# Patient Record
Sex: Female | Born: 1955 | Race: Black or African American | Hispanic: No | Smoking: Never smoker
Health system: Southern US, Community
[De-identification: ages and names within clinical notes are randomized; demographics above are authoritative.]

## PROBLEM LIST (undated history)

## (undated) DIAGNOSIS — E559 Vitamin D deficiency, unspecified: Secondary | ICD-10-CM

## (undated) DIAGNOSIS — E785 Hyperlipidemia, unspecified: Secondary | ICD-10-CM

## (undated) DIAGNOSIS — M79643 Pain in unspecified hand: Secondary | ICD-10-CM

## (undated) DIAGNOSIS — M1990 Unspecified osteoarthritis, unspecified site: Secondary | ICD-10-CM

## (undated) DIAGNOSIS — I10 Essential (primary) hypertension: Secondary | ICD-10-CM

## (undated) DIAGNOSIS — M549 Dorsalgia, unspecified: Secondary | ICD-10-CM

## (undated) DIAGNOSIS — M79606 Pain in leg, unspecified: Secondary | ICD-10-CM

## (undated) HISTORY — PX: CARPAL TUNNEL RELEASE: SHX101

## (undated) HISTORY — DX: Dorsalgia, unspecified: M54.9

## (undated) HISTORY — DX: Vitamin D deficiency, unspecified: E55.9

## (undated) HISTORY — DX: Hyperlipidemia, unspecified: E78.5

## (undated) HISTORY — PX: ABDOMINAL HYSTERECTOMY: SHX81

## (undated) HISTORY — DX: Pain in leg, unspecified: M79.606

## (undated) HISTORY — DX: Unspecified osteoarthritis, unspecified site: M19.90

## (undated) HISTORY — DX: Pain in unspecified hand: M79.643

---

## 1976-04-20 HISTORY — PX: TUBAL LIGATION: SHX77

## 2000-01-21 ENCOUNTER — Emergency Department (HOSPITAL_COMMUNITY): Admission: EM | Admit: 2000-01-21 | Discharge: 2000-01-22 | Payer: Self-pay

## 2000-08-24 ENCOUNTER — Emergency Department (HOSPITAL_COMMUNITY): Admission: EM | Admit: 2000-08-24 | Discharge: 2000-08-24 | Payer: Self-pay | Admitting: Emergency Medicine

## 2001-01-21 ENCOUNTER — Ambulatory Visit (HOSPITAL_COMMUNITY): Admission: RE | Admit: 2001-01-21 | Discharge: 2001-01-21 | Payer: Self-pay | Admitting: Family Medicine

## 2001-01-21 ENCOUNTER — Encounter: Payer: Self-pay | Admitting: Family Medicine

## 2001-01-24 ENCOUNTER — Encounter: Payer: Self-pay | Admitting: Family Medicine

## 2001-01-24 ENCOUNTER — Ambulatory Visit (HOSPITAL_COMMUNITY): Admission: RE | Admit: 2001-01-24 | Discharge: 2001-01-24 | Payer: Self-pay | Admitting: Family Medicine

## 2001-04-10 ENCOUNTER — Emergency Department (HOSPITAL_COMMUNITY): Admission: EM | Admit: 2001-04-10 | Discharge: 2001-04-10 | Payer: Self-pay

## 2001-04-10 ENCOUNTER — Encounter: Payer: Self-pay | Admitting: Emergency Medicine

## 2001-04-11 ENCOUNTER — Ambulatory Visit (HOSPITAL_COMMUNITY): Admission: RE | Admit: 2001-04-11 | Discharge: 2001-04-11 | Payer: Self-pay | Admitting: Emergency Medicine

## 2001-04-11 ENCOUNTER — Encounter: Payer: Self-pay | Admitting: Emergency Medicine

## 2001-04-16 ENCOUNTER — Emergency Department (HOSPITAL_COMMUNITY): Admission: EM | Admit: 2001-04-16 | Discharge: 2001-04-16 | Payer: Self-pay | Admitting: Emergency Medicine

## 2001-08-04 ENCOUNTER — Emergency Department (HOSPITAL_COMMUNITY): Admission: EM | Admit: 2001-08-04 | Discharge: 2001-08-04 | Payer: Self-pay

## 2001-08-15 ENCOUNTER — Encounter: Payer: Self-pay | Admitting: Neurosurgery

## 2001-08-15 ENCOUNTER — Ambulatory Visit (HOSPITAL_COMMUNITY): Admission: RE | Admit: 2001-08-15 | Discharge: 2001-08-15 | Payer: Self-pay | Admitting: Neurosurgery

## 2002-04-20 ENCOUNTER — Emergency Department (HOSPITAL_COMMUNITY): Admission: EM | Admit: 2002-04-20 | Discharge: 2002-04-21 | Payer: Self-pay

## 2002-07-10 ENCOUNTER — Encounter: Payer: Self-pay | Admitting: Family Medicine

## 2002-07-10 ENCOUNTER — Ambulatory Visit (HOSPITAL_COMMUNITY): Admission: RE | Admit: 2002-07-10 | Discharge: 2002-07-10 | Payer: Self-pay | Admitting: Family Medicine

## 2003-02-24 ENCOUNTER — Emergency Department (HOSPITAL_COMMUNITY): Admission: EM | Admit: 2003-02-24 | Discharge: 2003-02-24 | Payer: Self-pay | Admitting: Emergency Medicine

## 2003-04-12 ENCOUNTER — Ambulatory Visit (HOSPITAL_COMMUNITY): Admission: RE | Admit: 2003-04-12 | Discharge: 2003-04-12 | Payer: Self-pay | Admitting: Internal Medicine

## 2003-04-16 ENCOUNTER — Ambulatory Visit (HOSPITAL_COMMUNITY): Admission: RE | Admit: 2003-04-16 | Discharge: 2003-04-16 | Payer: Self-pay | Admitting: Internal Medicine

## 2003-06-08 ENCOUNTER — Encounter: Admission: RE | Admit: 2003-06-08 | Discharge: 2003-07-17 | Payer: Self-pay | Admitting: Orthopedic Surgery

## 2004-02-20 ENCOUNTER — Encounter: Admission: RE | Admit: 2004-02-20 | Discharge: 2004-02-20 | Payer: Self-pay | Admitting: Neurological Surgery

## 2004-04-25 ENCOUNTER — Ambulatory Visit (HOSPITAL_COMMUNITY): Admission: RE | Admit: 2004-04-25 | Discharge: 2004-04-25 | Payer: Self-pay | Admitting: Neurological Surgery

## 2004-09-22 ENCOUNTER — Emergency Department (HOSPITAL_COMMUNITY): Admission: EM | Admit: 2004-09-22 | Discharge: 2004-09-22 | Payer: Self-pay | Admitting: Family Medicine

## 2004-10-15 ENCOUNTER — Ambulatory Visit: Payer: Self-pay | Admitting: Nurse Practitioner

## 2004-10-15 ENCOUNTER — Ambulatory Visit: Payer: Self-pay | Admitting: *Deleted

## 2004-10-20 ENCOUNTER — Ambulatory Visit (HOSPITAL_COMMUNITY): Admission: RE | Admit: 2004-10-20 | Discharge: 2004-10-20 | Payer: Self-pay | Admitting: Family Medicine

## 2005-02-23 ENCOUNTER — Ambulatory Visit: Payer: Self-pay | Admitting: Nurse Practitioner

## 2005-06-09 ENCOUNTER — Ambulatory Visit: Payer: Self-pay | Admitting: Nurse Practitioner

## 2005-12-08 ENCOUNTER — Ambulatory Visit: Payer: Self-pay | Admitting: Nurse Practitioner

## 2006-03-12 ENCOUNTER — Emergency Department (HOSPITAL_COMMUNITY): Admission: EM | Admit: 2006-03-12 | Discharge: 2006-03-12 | Payer: Self-pay | Admitting: Emergency Medicine

## 2006-05-27 ENCOUNTER — Emergency Department (HOSPITAL_COMMUNITY): Admission: EM | Admit: 2006-05-27 | Discharge: 2006-05-27 | Payer: Self-pay | Admitting: Family Medicine

## 2006-05-28 ENCOUNTER — Ambulatory Visit: Payer: Self-pay | Admitting: Nurse Practitioner

## 2006-07-14 ENCOUNTER — Ambulatory Visit (HOSPITAL_COMMUNITY): Admission: RE | Admit: 2006-07-14 | Discharge: 2006-07-14 | Payer: Self-pay | Admitting: Family Medicine

## 2006-11-29 ENCOUNTER — Encounter (INDEPENDENT_AMBULATORY_CARE_PROVIDER_SITE_OTHER): Payer: Self-pay | Admitting: Nurse Practitioner

## 2006-11-29 ENCOUNTER — Ambulatory Visit: Payer: Self-pay | Admitting: Internal Medicine

## 2006-11-29 LAB — CONVERTED CEMR LAB
ALT: 11 units/L (ref 0–35)
AST: 15 units/L (ref 0–37)
Albumin: 4.2 g/dL (ref 3.5–5.2)
Alkaline Phosphatase: 62 units/L (ref 39–117)
BUN: 12 mg/dL (ref 6–23)
Basophils Absolute: 0 10*3/uL (ref 0.0–0.1)
Basophils Relative: 0 % (ref 0–1)
CO2: 25 meq/L (ref 19–32)
Calcium: 9.6 mg/dL (ref 8.4–10.5)
Chloride: 108 meq/L (ref 96–112)
Cholesterol: 202 mg/dL — ABNORMAL HIGH (ref 0–200)
Creatinine, Ser: 0.92 mg/dL (ref 0.40–1.20)
Eosinophils Absolute: 0.1 10*3/uL (ref 0.0–0.7)
Eosinophils Relative: 1 % (ref 0–5)
Glucose, Bld: 105 mg/dL — ABNORMAL HIGH (ref 70–99)
HCT: 41.9 % (ref 36.0–46.0)
HDL: 57 mg/dL (ref >39)
Hemoglobin: 13.1 g/dL (ref 12.0–15.0)
LDL Cholesterol: 117 mg/dL — ABNORMAL HIGH (ref 0–99)
Lymphocytes Relative: 35 % (ref 12–46)
Lymphs Abs: 2 10*3/uL (ref 0.7–3.3)
MCHC: 31.3 g/dL (ref 30.0–36.0)
MCV: 85.9 fL (ref 78.0–100.0)
Monocytes Absolute: 0.3 10*3/uL (ref 0.2–0.7)
Monocytes Relative: 5 % (ref 3–11)
Neutro Abs: 3.5 10*3/uL (ref 1.7–7.7)
Neutrophils Relative %: 59 % (ref 43–77)
Platelets: 218 10*3/uL (ref 150–400)
Potassium: 4.3 meq/L (ref 3.5–5.3)
RBC: 4.88 M/uL (ref 3.87–5.11)
RDW: 14.3 % — ABNORMAL HIGH (ref 11.5–14.0)
Rheumatoid fact SerPl-aCnc: <20 intl units/mL (ref 0–20)
Sodium: 143 meq/L (ref 135–145)
TSH: 0.846 microintl units/mL (ref 0.350–5.50)
Total Bilirubin: 0.6 mg/dL (ref 0.3–1.2)
Total CHOL/HDL Ratio: 3.5
Total Protein: 7.4 g/dL (ref 6.0–8.3)
Triglycerides: 138 mg/dL (ref <150)
VLDL: 28 mg/dL (ref 0–40)
WBC: 5.9 10*3/uL (ref 4.0–10.5)

## 2006-12-10 ENCOUNTER — Ambulatory Visit: Payer: Self-pay | Admitting: Family Medicine

## 2006-12-10 ENCOUNTER — Encounter (INDEPENDENT_AMBULATORY_CARE_PROVIDER_SITE_OTHER): Payer: Self-pay | Admitting: Nurse Practitioner

## 2006-12-10 LAB — CONVERTED CEMR LAB
Cholesterol: 205 mg/dL — ABNORMAL HIGH (ref 0–200)
HDL: 59 mg/dL (ref >39)
LDL Cholesterol: 118 mg/dL — ABNORMAL HIGH (ref 0–99)
Total CHOL/HDL Ratio: 3.5
Triglycerides: 142 mg/dL (ref <150)
VLDL: 28 mg/dL (ref 0–40)

## 2007-01-05 ENCOUNTER — Encounter (INDEPENDENT_AMBULATORY_CARE_PROVIDER_SITE_OTHER): Payer: Self-pay | Admitting: *Deleted

## 2007-05-23 ENCOUNTER — Emergency Department (HOSPITAL_COMMUNITY): Admission: EM | Admit: 2007-05-23 | Discharge: 2007-05-24 | Payer: Self-pay | Admitting: Emergency Medicine

## 2007-06-17 ENCOUNTER — Ambulatory Visit: Payer: Self-pay | Admitting: Family Medicine

## 2007-08-08 ENCOUNTER — Ambulatory Visit (HOSPITAL_COMMUNITY): Admission: RE | Admit: 2007-08-08 | Discharge: 2007-08-08 | Payer: Self-pay | Admitting: Nurse Practitioner

## 2008-02-28 ENCOUNTER — Ambulatory Visit: Payer: Self-pay | Admitting: Family Medicine

## 2008-02-28 ENCOUNTER — Encounter (INDEPENDENT_AMBULATORY_CARE_PROVIDER_SITE_OTHER): Payer: Self-pay | Admitting: Internal Medicine

## 2008-02-28 LAB — CONVERTED CEMR LAB
ALT: 24 units/L (ref 0–35)
AST: 19 units/L (ref 0–37)
Albumin: 4.1 g/dL (ref 3.5–5.2)
Alkaline Phosphatase: 87 units/L (ref 39–117)
BUN: 17 mg/dL (ref 6–23)
Basophils Absolute: 0 10*3/uL (ref 0.0–0.1)
Basophils Relative: 0 % (ref 0–1)
CO2: 23 meq/L (ref 19–32)
Calcium: 9.4 mg/dL (ref 8.4–10.5)
Chloride: 105 meq/L (ref 96–112)
Cholesterol: 189 mg/dL (ref 0–200)
Creatinine, Ser: 0.89 mg/dL (ref 0.40–1.20)
Eosinophils Absolute: 0.1 10*3/uL (ref 0.0–0.7)
Eosinophils Relative: 1 % (ref 0–5)
Ferritin: 59 ng/mL (ref 10–291)
Glucose, Bld: 162 mg/dL — ABNORMAL HIGH (ref 70–99)
HCT: 40.8 % (ref 36.0–46.0)
HDL: 52 mg/dL (ref >39)
Hemoglobin: 12.7 g/dL (ref 12.0–15.0)
LDL Cholesterol: 105 mg/dL — ABNORMAL HIGH (ref 0–99)
Lymphocytes Relative: 29 % (ref 12–46)
Lymphs Abs: 2.1 10*3/uL (ref 0.7–4.0)
MCHC: 31.1 g/dL (ref 30.0–36.0)
MCV: 84.8 fL (ref 78.0–100.0)
Microalb, Ur: 0.87 mg/dL (ref 0.00–1.89)
Monocytes Absolute: 0.4 10*3/uL (ref 0.1–1.0)
Monocytes Relative: 5 % (ref 3–12)
Neutro Abs: 4.7 10*3/uL (ref 1.7–7.7)
Neutrophils Relative %: 65 % (ref 43–77)
Platelets: 208 10*3/uL (ref 150–400)
Potassium: 3.8 meq/L (ref 3.5–5.3)
RBC / HPF: NONE SEEN (ref <3)
RBC: 4.81 M/uL (ref 3.87–5.11)
RDW: 14.5 % (ref 11.5–15.5)
Sodium: 142 meq/L (ref 135–145)
Total Bilirubin: 0.5 mg/dL (ref 0.3–1.2)
Total CHOL/HDL Ratio: 3.6
Total Protein: 7.5 g/dL (ref 6.0–8.3)
Triglycerides: 162 mg/dL — ABNORMAL HIGH (ref <150)
VLDL: 32 mg/dL (ref 0–40)
WBC, UA: NONE SEEN cells/hpf (ref <3)
WBC: 7.3 10*3/uL (ref 4.0–10.5)

## 2008-03-13 ENCOUNTER — Ambulatory Visit: Payer: Self-pay | Admitting: Internal Medicine

## 2008-05-03 ENCOUNTER — Encounter (INDEPENDENT_AMBULATORY_CARE_PROVIDER_SITE_OTHER): Payer: Self-pay | Admitting: Internal Medicine

## 2008-05-03 ENCOUNTER — Encounter: Payer: Self-pay | Admitting: Internal Medicine

## 2008-05-03 ENCOUNTER — Other Ambulatory Visit: Admission: RE | Admit: 2008-05-03 | Discharge: 2008-05-03 | Payer: Self-pay | Admitting: Internal Medicine

## 2008-05-03 ENCOUNTER — Ambulatory Visit: Payer: Self-pay | Admitting: Internal Medicine

## 2008-05-03 LAB — CONVERTED CEMR LAB
Chlamydia, DNA Probe: NEGATIVE
GC Probe Amp, Genital: NEGATIVE

## 2008-05-03 LAB — HM PAP SMEAR: HM Pap smear: NEGATIVE

## 2008-05-04 ENCOUNTER — Encounter (INDEPENDENT_AMBULATORY_CARE_PROVIDER_SITE_OTHER): Payer: Self-pay | Admitting: Internal Medicine

## 2008-05-14 ENCOUNTER — Ambulatory Visit: Payer: Self-pay | Admitting: Internal Medicine

## 2008-06-04 ENCOUNTER — Ambulatory Visit: Payer: Self-pay | Admitting: Internal Medicine

## 2008-06-21 ENCOUNTER — Ambulatory Visit (HOSPITAL_COMMUNITY): Admission: RE | Admit: 2008-06-21 | Discharge: 2008-06-21 | Payer: Self-pay | Admitting: Internal Medicine

## 2008-08-06 ENCOUNTER — Encounter (INDEPENDENT_AMBULATORY_CARE_PROVIDER_SITE_OTHER): Payer: Self-pay | Admitting: Internal Medicine

## 2008-08-06 ENCOUNTER — Ambulatory Visit: Payer: Self-pay | Admitting: Internal Medicine

## 2008-08-06 LAB — CONVERTED CEMR LAB
ALT: 24 units/L (ref 0–35)
AST: 18 units/L (ref 0–37)
Albumin: 4.1 g/dL (ref 3.5–5.2)
Alkaline Phosphatase: 84 units/L (ref 39–117)
BUN: 14 mg/dL (ref 6–23)
CO2: 24 meq/L (ref 19–32)
Calcium: 9.6 mg/dL (ref 8.4–10.5)
Chloride: 105 meq/L (ref 96–112)
Cholesterol: 190 mg/dL (ref 0–200)
Creatinine, Ser: 0.86 mg/dL (ref 0.40–1.20)
Glucose, Bld: 182 mg/dL — ABNORMAL HIGH (ref 70–99)
HDL: 48 mg/dL (ref >39)
LDL Cholesterol: 108 mg/dL — ABNORMAL HIGH (ref 0–99)
Potassium: 4.3 meq/L (ref 3.5–5.3)
Sodium: 142 meq/L (ref 135–145)
TSH: 0.591 microintl units/mL (ref 0.350–4.500)
Total Bilirubin: 0.5 mg/dL (ref 0.3–1.2)
Total CHOL/HDL Ratio: 4
Total Protein: 7.5 g/dL (ref 6.0–8.3)
Triglycerides: 168 mg/dL — ABNORMAL HIGH (ref <150)
VLDL: 34 mg/dL (ref 0–40)

## 2008-10-12 ENCOUNTER — Ambulatory Visit (HOSPITAL_COMMUNITY): Admission: RE | Admit: 2008-10-12 | Discharge: 2008-10-12 | Payer: Self-pay | Admitting: Internal Medicine

## 2008-10-12 LAB — HM MAMMOGRAPHY: HM Mammogram: NEGATIVE

## 2008-11-05 ENCOUNTER — Ambulatory Visit: Payer: Self-pay | Admitting: Internal Medicine

## 2009-01-13 ENCOUNTER — Emergency Department (HOSPITAL_COMMUNITY): Admission: EM | Admit: 2009-01-13 | Discharge: 2009-01-13 | Payer: Self-pay | Admitting: Emergency Medicine

## 2009-02-05 ENCOUNTER — Encounter (INDEPENDENT_AMBULATORY_CARE_PROVIDER_SITE_OTHER): Payer: Self-pay | Admitting: Internal Medicine

## 2009-02-05 ENCOUNTER — Ambulatory Visit: Payer: Self-pay | Admitting: Internal Medicine

## 2009-02-05 LAB — CONVERTED CEMR LAB
ALT: 19 units/L (ref 0–35)
AST: 15 units/L (ref 0–37)
Albumin: 4.1 g/dL (ref 3.5–5.2)
Alkaline Phosphatase: 86 units/L (ref 39–117)
BUN: 16 mg/dL (ref 6–23)
CO2: 23 meq/L (ref 19–32)
Calcium: 9.4 mg/dL (ref 8.4–10.5)
Chloride: 105 meq/L (ref 96–112)
Cholesterol: 183 mg/dL (ref 0–200)
Creatinine, Ser: 0.87 mg/dL (ref 0.40–1.20)
Glucose, Bld: 153 mg/dL — ABNORMAL HIGH (ref 70–99)
HDL: 51 mg/dL (ref >39)
LDL Cholesterol: 108 mg/dL — ABNORMAL HIGH (ref 0–99)
Potassium: 4.1 meq/L (ref 3.5–5.3)
Sodium: 141 meq/L (ref 135–145)
Total Bilirubin: 0.5 mg/dL (ref 0.3–1.2)
Total CHOL/HDL Ratio: 3.6
Total Protein: 7.3 g/dL (ref 6.0–8.3)
Triglycerides: 121 mg/dL (ref <150)
VLDL: 24 mg/dL (ref 0–40)

## 2009-02-28 ENCOUNTER — Ambulatory Visit: Payer: Self-pay | Admitting: Family Medicine

## 2009-02-28 ENCOUNTER — Encounter (INDEPENDENT_AMBULATORY_CARE_PROVIDER_SITE_OTHER): Payer: Self-pay | Admitting: *Deleted

## 2009-03-12 ENCOUNTER — Ambulatory Visit: Payer: Self-pay | Admitting: Family Medicine

## 2009-03-18 ENCOUNTER — Encounter (INDEPENDENT_AMBULATORY_CARE_PROVIDER_SITE_OTHER): Payer: Self-pay | Admitting: *Deleted

## 2009-04-16 ENCOUNTER — Encounter (INDEPENDENT_AMBULATORY_CARE_PROVIDER_SITE_OTHER): Payer: Self-pay | Admitting: *Deleted

## 2009-04-22 ENCOUNTER — Ambulatory Visit: Payer: Self-pay | Admitting: Gastroenterology

## 2009-05-08 ENCOUNTER — Encounter (INDEPENDENT_AMBULATORY_CARE_PROVIDER_SITE_OTHER): Payer: Self-pay | Admitting: *Deleted

## 2009-05-08 ENCOUNTER — Telehealth (INDEPENDENT_AMBULATORY_CARE_PROVIDER_SITE_OTHER): Payer: Self-pay | Admitting: *Deleted

## 2009-05-13 ENCOUNTER — Ambulatory Visit: Payer: Self-pay | Admitting: Family Medicine

## 2009-06-07 ENCOUNTER — Emergency Department (HOSPITAL_COMMUNITY): Admission: EM | Admit: 2009-06-07 | Discharge: 2009-06-07 | Payer: Self-pay | Admitting: Family Medicine

## 2009-07-15 ENCOUNTER — Ambulatory Visit: Payer: Self-pay | Admitting: Family Medicine

## 2009-07-16 ENCOUNTER — Emergency Department (HOSPITAL_COMMUNITY): Admission: EM | Admit: 2009-07-16 | Discharge: 2009-07-16 | Payer: Self-pay | Admitting: Family Medicine

## 2009-11-14 ENCOUNTER — Ambulatory Visit: Payer: Self-pay | Admitting: Family Medicine

## 2010-05-11 ENCOUNTER — Encounter: Payer: Self-pay | Admitting: Family Medicine

## 2010-05-20 NOTE — Progress Notes (Signed)
   Phone Note Outgoing Call   Summary of Call: Pt. did not arrive at scheduled time for colonoscopy.  Phoned pt. at home.  She stated that she did not have anyone to come with her for her procedure.  She had completed her prep but the person who was supposed to come with her could not.  Asked her if she was sure she could not find someone and she said that there was no one else.  Asked her if she would like to reschedule and she said that she would like to make sure she had someone to come with her before she rescheduled. Initial call taken by: Jennye Boroughs RN,  May 08, 2009 2:17 PM

## 2010-05-20 NOTE — Letter (Signed)
Summary: [facility] Cancel and No Reschedule  [facility]  [address] [place], [place] [address]   Phone: [phone]  Fax: [phone]      May 08, 2009 MRN: [id number]   [facility] [address] [place], [place]  [address]     You recently cancelled your endoscopic procedure at the [facility] and did not reschedule for another date.    Your provider recommended this procedure for the benefit of your health.  It is very important that you reschedule it.  Failure to do so may be to the detriment of your health.  Please call us at [phone] and we will be happy to assist you with rescheduling.    If you were referred for this procedure by another physician/provider, we will notify him/her that you did not keep your appointment.   Sincerely,   [facility]

## 2010-05-20 NOTE — Miscellaneous (Signed)
Summary: direct colon screen age/ks   Clinical Lists Changes  Medications: Added new medication of MOVIPREP 100 GM  SOLR (PEG-KCL-NACL-NASULF-NA ASC-C) As directed - Signed Rx of MOVIPREP 100 GM  SOLR (PEG-KCL-NACL-NASULF-NA ASC-C) As directed;  #1 x 0;  Signed;  Entered by: Clide Cliff RN;  Authorized by: Meryl Dare MD [facility];  Method used: Electronically to [facility] [address] [id number]*, [address] [place], [place]  [address], Ph: [phone], Fax: [phone] Observations: Added new observation of NKA: T (04/22/2009 13:51)    Prescriptions: MOVIPREP 100 GM  SOLR (PEG-KCL-NACL-NASULF-NA ASC-C) As directed  #1 x 0   Entered by:   Clide Cliff RN   Authorized by:   Meryl Dare MD [facility]   Signed by:   Clide Cliff RN on 04/22/2009   Method used:   Electronically to        [facility] [address] [phone]* (retail)       [address]       [address]       [place], [place]  [address]       Ph: [phone]       Fax: [phone]   RxID:   [id number]

## 2010-06-24 ENCOUNTER — Ambulatory Visit (INDEPENDENT_AMBULATORY_CARE_PROVIDER_SITE_OTHER): Payer: 59 | Admitting: Family Medicine

## 2010-06-24 DIAGNOSIS — I10 Essential (primary) hypertension: Secondary | ICD-10-CM

## 2010-06-24 DIAGNOSIS — E119 Type 2 diabetes mellitus without complications: Secondary | ICD-10-CM

## 2010-06-24 DIAGNOSIS — E785 Hyperlipidemia, unspecified: Secondary | ICD-10-CM

## 2010-06-24 DIAGNOSIS — Z9119 Patient's noncompliance with other medical treatment and regimen: Secondary | ICD-10-CM

## 2010-07-31 LAB — CREATININE, SERUM
Creatinine, Ser: 0.88 mg/dL (ref 0.4–1.2)
GFR calc Af Amer: >60 mL/min (ref >60)
GFR calc non Af Amer: >60 mL/min (ref >60)

## 2010-07-31 LAB — BUN: BUN: 15 mg/dL (ref 6–23)

## 2010-09-05 NOTE — Op Note (Signed)
NAMEELLOWYN, RIEVES              ACCOUNT NO.:  [id number]   MEDICAL RECORD NO.:  [id number]          PATIENT TYPE:  OIB   LOCATION:  2899                         FACILITY:  [facility]   PHYSICIAN:  Tia Alert, MD     DATE OF BIRTH:  [birth year]   DATE OF PROCEDURE:  04/25/2004  DATE OF DISCHARGE:                                 OPERATIVE REPORT   PREOPERATIVE DIAGNOSIS:  Right carpal tunnel syndrome.   POSTOPERATIVE DIAGNOSIS:  Right carpal tunnel syndrome.   PROCEDURE:  Right carpal tunnel release.   SURGEON:  Tia Alert, MD   ANESTHESIA:  Right arm block with local standby.   COMPLICATIONS:  None apparent.   INDICATION FOR PROCEDURE:  Ms. Edelen is a 55 year old black female who was  referred to the neurosurgery clinic with complaints of numbness in her right  hand.  She had nerve conduction studies consistent with a right carpal  tunnel syndrome.  She had no demonstrable weakness in the hand or atrophy  but did have numbness.  I recommended a right carpal tunnel release.  She  understood the risks, benefits, and alternatives and wished to proceed.   DESCRIPTION OF PROCEDURE:  The patient was taken to the operating room and  placed in a supine position on the operating room table.  Her right arm was  prepped circumferentially with DuraPrep and draped in the usual sterile  fashion.  We did inject 7 mL of local anesthesia and an incision was made  within the palm just distal to the distal wrist crease and dissected down  through the palmar fascia to localize the transverse carpal ligament.  The  transverse carpal ligament was opened to expose the underlying median nerve.  I then used a hemostat to dissect between the median nerve and the  transverse carpal ligament and transected this distally until the palmar fat  was seen.  I then used a hemostat to palpate to assure complete transection  of the transverse carpal ligament distally into the palm.  I then dissected  more  proximally, again spreading with a hemostat between the transverse  carpal ligament and the median nerve and dissected until I completely  transected the transverse carpal ligament more proximally in the wrist.  Once the transverse carpal ligament was completely transected, I inspected  the nerve once again and irrigated with saline solution, then closed the  palmar fascia with interrupted 3-0 Vicryl, closed the subcuticular tissue  with 3-0 Vicryl, and closed the skin with interrupted 4-0 Ethilon vertical  mattress sutures.  The hand was then wrapped with Kerlix and an Ace bandage.  The patient was then taken to the recovery room in stable condition.  At the  end of the procedure, all sponge, needle, and instrument counts were  correct.      Markus.Osmond  DSJ/MEDQ  D:  04/25/2004  T:  04/25/2004  Job:  [id number]

## 2010-10-19 IMAGING — MG MM DIGITAL SCREENING BILAT
6 series · 6 of 6 positions shown · non-contrast
Comparison: none

DG SCREEN MAMMOGRAM BILATERAL
Bilateral CC and MLO view(s) were taken.
Technologist: Bacilio, Gretchen.(SABEHO)(M)

DIGITAL SCREENING MAMMOGRAM WITH CAD:
There are scattered fibroglandular densities.  No masses or malignant type calcifications are 
identified.  Compared with prior studies.
Images were processed with CAD.

[R CC]
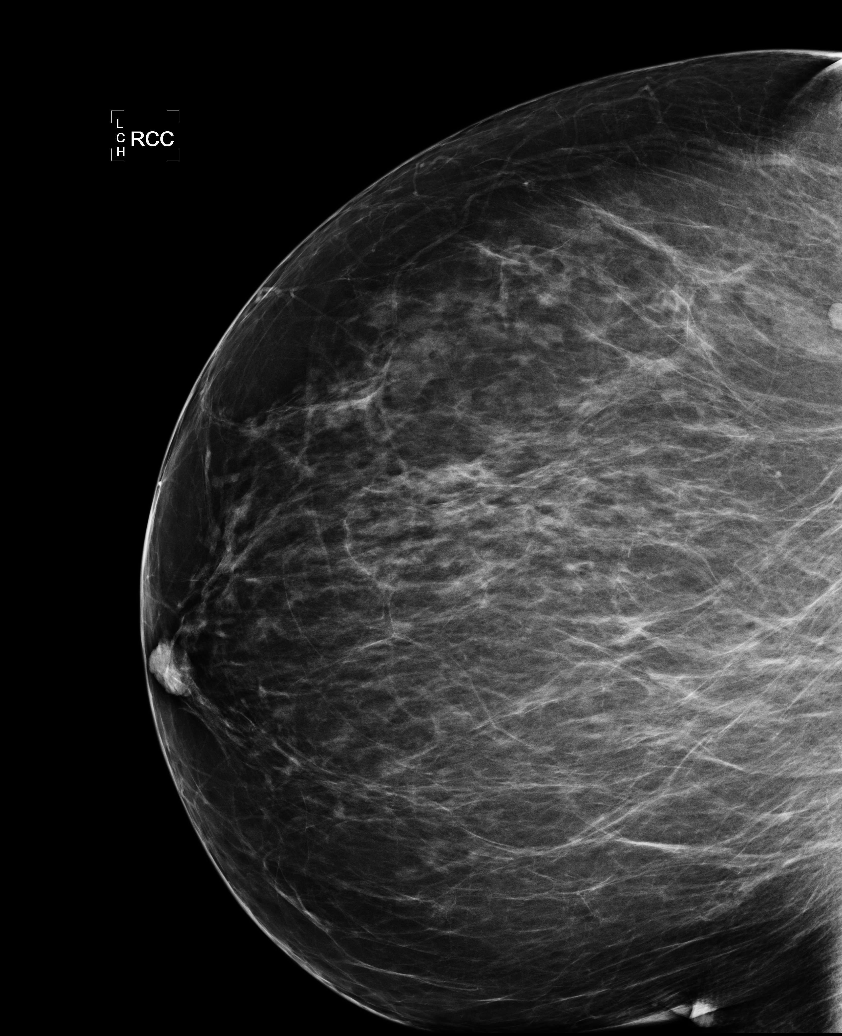

[R MLO]
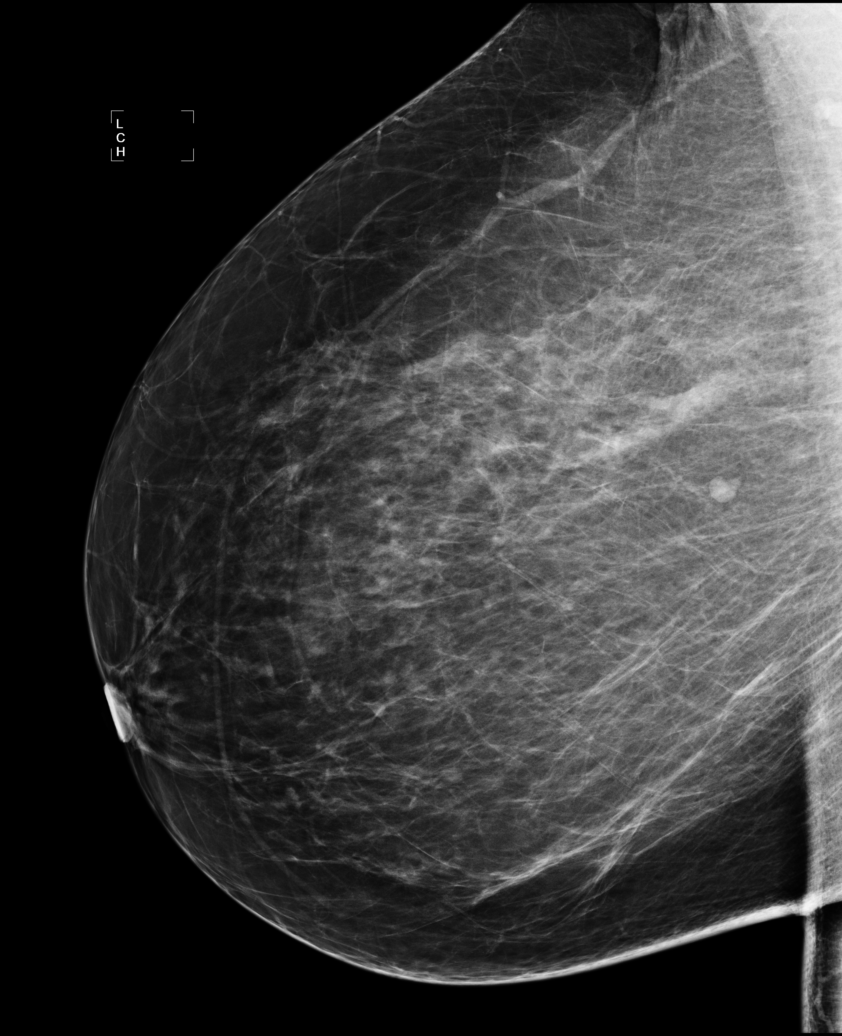

[L CC]
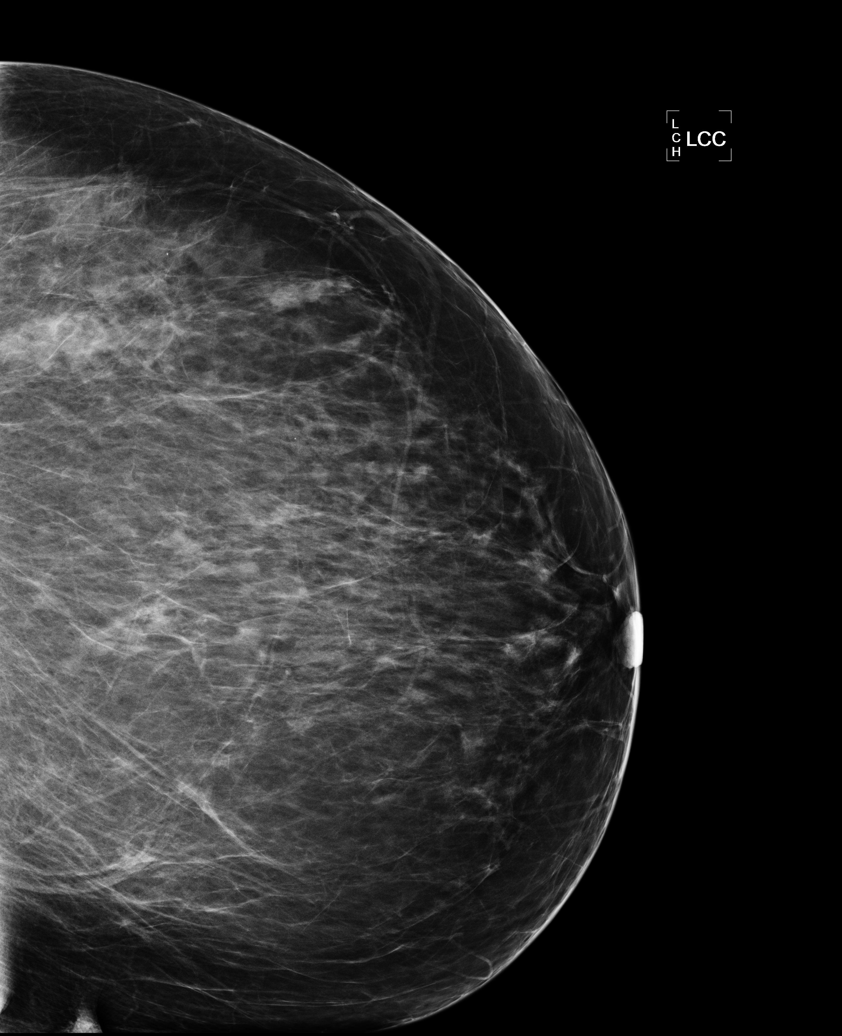

[L MLO (1 of 2)]
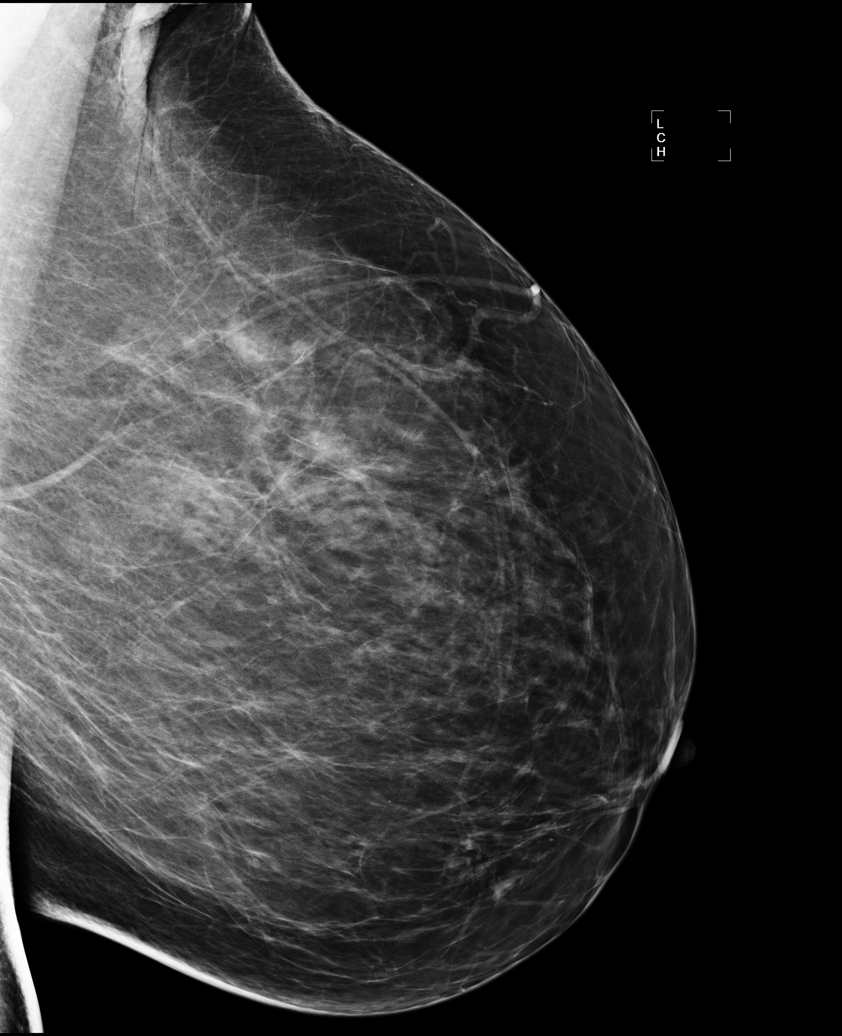

[R CV]
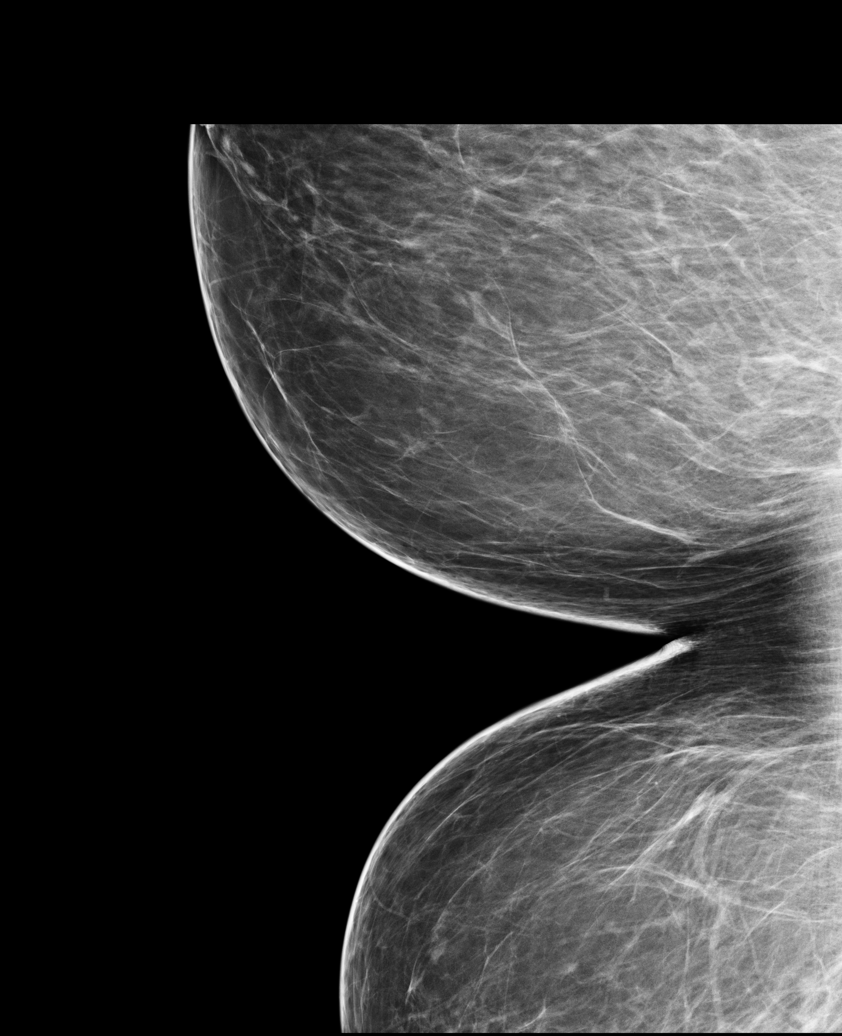

[L MLO (2 of 2)]
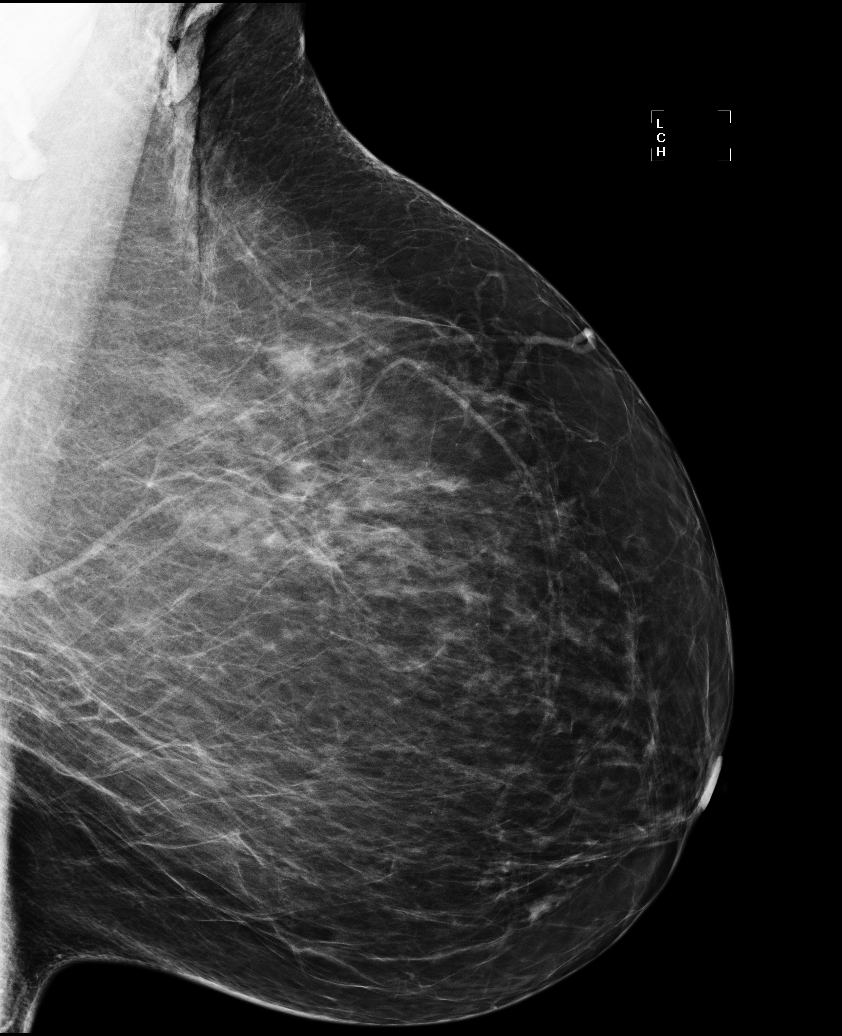

[6 of 6 positions shown; findings below may reference images not displayed]

IMPRESSION: No specific mammographic evidence of malignancy.  Next screening mammogram is recommended in one 
year.

A result letter of this screening mammogram will be mailed directly to the patient.

ASSESSMENT: Negative - BI-RADS 1

Screening mammogram in 1 year.
ANALYZED BY COMPUTER AIDED DETECTION. , THIS PROCEDURE WAS A DIGITAL MAMMOGRAM.

## 2010-10-24 ENCOUNTER — Encounter: Payer: Self-pay | Admitting: Family Medicine

## 2010-10-27 ENCOUNTER — Ambulatory Visit (INDEPENDENT_AMBULATORY_CARE_PROVIDER_SITE_OTHER): Payer: 59 | Admitting: Family Medicine

## 2010-10-27 ENCOUNTER — Encounter: Payer: Self-pay | Admitting: Family Medicine

## 2010-10-27 DIAGNOSIS — E1169 Type 2 diabetes mellitus with other specified complication: Secondary | ICD-10-CM

## 2010-10-27 DIAGNOSIS — E785 Hyperlipidemia, unspecified: Secondary | ICD-10-CM

## 2010-10-27 DIAGNOSIS — E1159 Type 2 diabetes mellitus with other circulatory complications: Secondary | ICD-10-CM

## 2010-10-27 DIAGNOSIS — M839 Adult osteomalacia, unspecified: Secondary | ICD-10-CM

## 2010-10-27 DIAGNOSIS — G8929 Other chronic pain: Secondary | ICD-10-CM | POA: Insufficient documentation

## 2010-10-27 DIAGNOSIS — Z79899 Other long term (current) drug therapy: Secondary | ICD-10-CM

## 2010-10-27 DIAGNOSIS — E119 Type 2 diabetes mellitus without complications: Secondary | ICD-10-CM

## 2010-10-27 DIAGNOSIS — I10 Essential (primary) hypertension: Secondary | ICD-10-CM

## 2010-10-27 DIAGNOSIS — M549 Dorsalgia, unspecified: Secondary | ICD-10-CM

## 2010-10-27 LAB — POCT GLYCOSYLATED HEMOGLOBIN (HGB A1C): Hemoglobin A1C: 7.2

## 2010-10-27 MED ORDER — PIOGLITAZONE HCL-METFORMIN HCL 15-850 MG PO TABS: 1.0000 | ORAL_TABLET | Freq: Two times a day (BID) | ORAL | Status: DC

## 2010-10-27 MED ORDER — ROSUVASTATIN CALCIUM 40 MG PO TABS: 40.0000 mg | ORAL_TABLET | Freq: Every day | ORAL | Status: DC

## 2010-10-27 MED ORDER — GLIPIZIDE 5 MG PO TABS: 5.0000 mg | ORAL_TABLET | Freq: Two times a day (BID) | ORAL | Status: DC

## 2010-10-27 NOTE — Patient Instructions (Signed)
Make sure you get your colonoscopy done. We will set up for mammogram for you. I renewed your medications. We will also get to an eye doctor appointment.

## 2010-10-27 NOTE — Progress Notes (Signed)
  Subjective:    Patient ID: Deborah Camacho, female    DOB: 05/15/1955, 55 y.o.   MRN: [id number]  HPI She is here for a diabetes check. She states her blood sugars run in the 130 range. She does walk minimally. Her weight is down. She does not smoke or drink. She did not get a colonoscopy scheduled but is planning to do this. Also she needs and I exam. She does check her feet minimally. She does have chronic back pain as well as some arthritis. Presently she is on gabapentin and tramadol which apparently is helping.   Review of Systems     Objective:   Physical Exam Alert and in no distress. Blood pressure and weight are recorded.       Assessment & Plan:  Diabetes. Hypertension. Dyslipidemia. Chronic back pain. Vitamin D deficiency I will check a vitamin D level. Encouraged her to continue with taking good care of herself. Recheck here in 4 months. Her meds  will be renewed

## 2010-10-28 LAB — LIPID PANEL
LDL Cholesterol: 141 mg/dL — ABNORMAL HIGH (ref 0–99)
Triglycerides: 117 mg/dL (ref <150)

## 2010-10-29 ENCOUNTER — Telehealth: Payer: Self-pay

## 2010-10-29 NOTE — Telephone Encounter (Signed)
Called pt to let her know about lab to take crestor and good multi vit

## 2011-03-02 ENCOUNTER — Ambulatory Visit: Payer: 59 | Admitting: Family Medicine

## 2011-03-15 ENCOUNTER — Emergency Department (HOSPITAL_COMMUNITY)
Admission: EM | Admit: 2011-03-15 | Discharge: 2011-03-15 | Disposition: A | Discharge status: Home / Self Care | Payer: 59 | Attending: Emergency Medicine | Admitting: Emergency Medicine

## 2011-03-15 ENCOUNTER — Emergency Department (HOSPITAL_COMMUNITY): Payer: 59

## 2011-03-15 ENCOUNTER — Encounter (HOSPITAL_COMMUNITY): Payer: Self-pay | Admitting: Emergency Medicine

## 2011-03-15 DIAGNOSIS — S239XXA Sprain of unspecified parts of thorax, initial encounter: Secondary | ICD-10-CM | POA: Insufficient documentation

## 2011-03-15 DIAGNOSIS — E119 Type 2 diabetes mellitus without complications: Secondary | ICD-10-CM | POA: Insufficient documentation

## 2011-03-15 DIAGNOSIS — R079 Chest pain, unspecified: Secondary | ICD-10-CM | POA: Insufficient documentation

## 2011-03-15 DIAGNOSIS — I10 Essential (primary) hypertension: Secondary | ICD-10-CM | POA: Insufficient documentation

## 2011-03-15 DIAGNOSIS — S161XXA Strain of muscle, fascia and tendon at neck level, initial encounter: Secondary | ICD-10-CM

## 2011-03-15 DIAGNOSIS — Y92410 Unspecified street and highway as the place of occurrence of the external cause: Secondary | ICD-10-CM | POA: Insufficient documentation

## 2011-03-15 DIAGNOSIS — S139XXA Sprain of joints and ligaments of unspecified parts of neck, initial encounter: Secondary | ICD-10-CM | POA: Insufficient documentation

## 2011-03-15 HISTORY — DX: Essential (primary) hypertension: I10

## 2011-03-15 MED ORDER — MORPHINE SULFATE 4 MG/ML IJ SOLN
6.0000 mg | Freq: Once | INTRAMUSCULAR | Status: AC
  Administered 2011-03-15: 6 mg via INTRAMUSCULAR

## 2011-03-15 MED ORDER — HYDROMORPHONE HCL PF 1 MG/ML IJ SOLN: 1.0000 mg | Freq: Once | INTRAMUSCULAR | Status: DC

## 2011-03-15 MED ORDER — HYDROCODONE-ACETAMINOPHEN 5-500 MG PO TABS: 1.0000 | ORAL_TABLET | Freq: Four times a day (QID) | ORAL | Status: AC | PRN

## 2011-03-15 MED ORDER — MORPHINE SULFATE 10 MG/ML IJ SOLN
INTRAMUSCULAR | Status: AC
  Filled 2011-03-15: qty 1

## 2011-03-15 MED ORDER — CYCLOBENZAPRINE HCL 10 MG PO TABS: 10.0000 mg | ORAL_TABLET | Freq: Two times a day (BID) | ORAL | Status: AC | PRN
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
## 2011-05-01 ENCOUNTER — Encounter: Payer: Self-pay | Admitting: Medical

## 2011-05-01 ENCOUNTER — Ambulatory Visit (INDEPENDENT_AMBULATORY_CARE_PROVIDER_SITE_OTHER): Payer: 59 | Admitting: Medical

## 2011-05-01 VITALS — BP 130/88 | HR 72 | Temp 98.2°F | Resp 16 | Wt 168.0 lb

## 2011-05-01 DIAGNOSIS — R52 Pain, unspecified: Secondary | ICD-10-CM

## 2011-05-01 DIAGNOSIS — R509 Fever, unspecified: Secondary | ICD-10-CM | POA: Insufficient documentation

## 2011-05-01 DIAGNOSIS — R05 Cough: Secondary | ICD-10-CM

## 2011-05-01 MED ORDER — OSELTAMIVIR PHOSPHATE 75 MG PO CAPS: 75.0000 mg | ORAL_CAPSULE | Freq: Two times a day (BID) | ORAL | Status: AC

## 2011-05-01 NOTE — Patient Instructions (Signed)
Begin Tamiflu for flu like symptoms, rest, drink plenty of fluids, and you can use Tylenol or Ibuprofen for aches, fever, and not feeling well.   Either continue Alka Seltzer or you can try Mucinex DM OTC for congestion and cough.  Consider nasal saline flush for sinuses.    If not improving by Monday, please call back.    Influenza, Adult Influenza ("the flu") is a viral infection of the respiratory tract. It causes chills, fever, cough, headache, body aches, and sore throat. Influenza in general will make you feel sicker than when you have a common cold. Symptoms of the illness typically last a few days. Cough and fatigue may continue for as long as 7 to 10 days. Influenza is highly contagious. It spreads easily to others in the droplets from coughs and sneezes. People frequently become infected by touching something that was recently contaminated with the virus and then touch their mouth, nose or eyes. This infection is caused by a virus. Symptoms will not be reduced or improved by taking an antibiotic. Antibiotics are medications that kill bacteria, not viruses. DIAGNOSIS  Diagnosis of influenza is often made based on the history and physical examination as well as the presence of influenza reports occurring in your community. Testing can be done if the diagnosis is not certain. TREATMENT  Since influenza is caused by a virus, antibiotics are not helpful. Your caregiver may prescribe antiviral medicines to shorten the illness and lessen the severity. Your caregiver may also recommend influenza vaccination and/or antiviral medicines for your family members in order to prevent the spread of influenza to them. HOME CARE INSTRUCTIONS  DO NOT GIVE ASPIRIN TO PERSONS WITH INFLUENZA WHO ARE UNDER 33 YEARS OF AGE. This could lead to brain and liver damage (Reye's syndrome). Read the label on over-the-counter medicines.   Stay home from work or school if at all possible until most of your symptoms are  gone.   Only take over-the-counter or prescription medicines for pain, discomfort, or fever as directed by your caregiver.   Use a cool mist humidifier to increase air moisture. This will make breathing easier.   Rest until your temperature is nearly normal: 98.6 F (37 C). This usually takes 3 to 4 days. Be sure you get plenty of rest.   Drink at least eight, eight-ounce glasses of fluids per day. Fluids include water, juice, broth, gelatin, or lemonade.   Cover your mouth and nose when coughing or sneezing and wash your hands often to prevent the spread of this virus to other persons.  PREVENTION  Annual influenza vaccination (flu shots) is the best way to avoid getting influenza. An annual flu shot is now routinely recommended for all adults in the [place] SEEK MEDICAL CARE IF:   You develop shortness of breath while resting.   You have a deep cough with production of mucous or chest pain.   You develop nausea (feeling sick to your stomach), vomiting, or diarrhea.  SEEK IMMEDIATE MEDICAL CARE IF:   You have difficulty breathing, become short of breath, or your skin or nails turn bluish.   You develop severe neck pain or stiffness.   You develop a severe headache, facial pain, or earache.   You have a fever.   You develop nausea or vomiting that cannot be controlled.  Document Released: 04/03/2000 Document Revised: 12/17/2010 Document Reviewed: 02/06/2009 [facility] Patient Information 2012 [place], [place].

## 2011-05-01 NOTE — Progress Notes (Signed)
Subjective:  Deborah Camacho is a 56 y.o. female who presents for 2 day history of abrupt onset body aches, fever, headache, congestion in head and chest, cough, nausea, sneezing, head pressure. She did not get a flu shot this year. She is a nonsmoker. She does have sick contacts at work. She was sent home from work due to fever.  She does have some productive sputum.  High risk factors for influenza complications: diabetic.  She denies ear pain, chest pain, shortness of breath, wheezing. She is using Alka-Seltzer cold and flu. Her recent glucose morning readings is 140 on average.     Past Medical History  Diagnosis Date  . Diabetes mellitus   . Hypertension    ROS: Gen.: Positive fever, chills, sweats Heart: No chest pain or palpitations Lungs: No shortness of breath or wheezing GI: Positive nausea, no vomiting or diarrhea  GU: No dysuria    Objective:      General: Ill-appearing, well-developed, well-nourished Skin: Hot, moist HEENT: Nose inflamed and congested, clear conjunctiva, TMs pearly, no sinus tenderness, pharynx with erythema, no exudates Neck: Supple, nontender, shotty cervical adenopathy Heart: Regular rate and rhythm, normal S1, S2, no murmurs Lungs: Clear to auscultation bilaterally, no wheezes, rales, rhonchi Abdomen: Nontender non distended Extremities: Mild generalized tenderness      Assessment and Plan:   Encounter Diagnoses  Name Primary?  . Fever Yes  . Cough   . Body aches    Flu test negative in office.  Discussed her symptoms, exam findings, and prevalence of flu positive contacts within the community, and we will treat empirically for influenza although the test was negative.  She'll begin Tamiflu, rest, hydrate well, can continue over-the-counter cough and decongestant medication as needed.  Prevention discussed, possible complications of the flu discussed.  Call or return if not improving, but to the ED if difficulty breathing over the weekend.

## 2011-05-04 ENCOUNTER — Telehealth: Payer: Self-pay | Admitting: Family Medicine

## 2011-05-04 NOTE — Telephone Encounter (Signed)
Message copied by Janeice Robinson on Mon May 04, 2011 11:28 AM ------      Message from: Aleen Campi, DAVID S      Created: Mon May 04, 2011  8:43 AM       pls call and see how she is doing.  I saw her Friday for illness

## 2011-05-04 NOTE — Telephone Encounter (Signed)
I called and spoke with the patient to see how she was feeling. Patient states the same. She said that she was unable to get the Tamiflu because of the cost. ($105.00). Patient states that her current sx. Are chest congestion but not coughing up anything. She states she cant cough anything up. And her back feels like it is burning. Patient states no fever. CLS  [name] states that she will need to try muscinex DM, fluids and rest. If she is nit any better by Wednesday to give [place] a call back in the invent that we will need to put her on an antibiotic. CLS   Patient was advised. CLS

## 2011-05-05 LAB — POCT INFLUENZA A/B
Influenza A, POC: POSITIVE
Influenza B, POC: NEGATIVE

## 2011-07-23 ENCOUNTER — Ambulatory Visit (INDEPENDENT_AMBULATORY_CARE_PROVIDER_SITE_OTHER): Payer: 59 | Admitting: Family Medicine

## 2011-07-23 ENCOUNTER — Encounter: Payer: Self-pay | Admitting: Family Medicine

## 2011-07-23 DIAGNOSIS — E119 Type 2 diabetes mellitus without complications: Secondary | ICD-10-CM

## 2011-07-23 DIAGNOSIS — G2581 Restless legs syndrome: Secondary | ICD-10-CM

## 2011-07-23 DIAGNOSIS — E669 Obesity, unspecified: Secondary | ICD-10-CM

## 2011-07-23 DIAGNOSIS — E785 Hyperlipidemia, unspecified: Secondary | ICD-10-CM

## 2011-07-23 DIAGNOSIS — I10 Essential (primary) hypertension: Secondary | ICD-10-CM

## 2011-07-23 DIAGNOSIS — E1169 Type 2 diabetes mellitus with other specified complication: Secondary | ICD-10-CM

## 2011-07-23 LAB — POCT GLYCOSYLATED HEMOGLOBIN (HGB A1C): Hemoglobin A1C: 6.7

## 2011-07-23 LAB — LIPID PANEL
HDL: 47 mg/dL (ref >39)
Triglycerides: 127 mg/dL (ref <150)

## 2011-07-23 MED ORDER — PIOGLITAZONE HCL-METFORMIN HCL 15-850 MG PO TABS: 1.0000 | ORAL_TABLET | Freq: Two times a day (BID) | ORAL | Status: DC

## 2011-07-23 MED ORDER — ROSUVASTATIN CALCIUM 40 MG PO TABS: 40.0000 mg | ORAL_TABLET | Freq: Every day | ORAL | Status: DC

## 2011-07-23 MED ORDER — PRAMIPEXOLE DIHYDROCHLORIDE 0.125 MG PO TABS
0.1250 mg | ORAL_TABLET | Freq: Every day | ORAL | Status: DC
Start: 2011-07-23 — End: 2012-07-22

## 2011-07-23 MED ORDER — LISINOPRIL-HYDROCHLOROTHIAZIDE 10-12.5 MG PO TABS: 1.0000 | ORAL_TABLET | Freq: Every day | ORAL | Status: DC

## 2011-07-23 NOTE — Progress Notes (Signed)
  Subjective:    Patient ID: Deborah Camacho, female    DOB: 1955-12-13, 56 y.o.   MRN: [id number]  HPI She is here for a followup visit. She has not been here in several months. She does occasionally check her blood sugars and they apparently run in the 140 range. She does check her feet periodically. She had an eye exam in July of last year.. She is being followed by orthopedics for bilateral knee pain and is on pain medications for this. She also complains of restless leg type symptoms for several years it does interfere with her sleep. She has not tried any medications. She does not smoke or drink.   Review of Systems     Objective:   Physical Exam Alert and in no distress. Hemoglobin A1c is 6.7.      Assessment & Plan:   1. Diabetes mellitus  POCT HgB A1C, POCT UA - Microalbumin, pioglitazone-metformin (ACTOPLUS MET) 15-850 MG per tablet  2. Hyperlipidemia LDL goal <70  Lipid panel, rosuvastatin (CRESTOR) 40 MG tablet  3. RLS (restless legs syndrome)  pramipexole (MIRAPEX) 0.125 MG tablet  4. Obesity (BMI 30-39.9)    5. Hypertension associated with diabetes  lisinopril-hydrochlorothiazide (PRINZIDE,ZESTORETIC) 10-12.5 MG per tablet   her symptoms are consistent with RLS. I will try  her on Mirapex. Discussed possibly increasing  medication. She will let me know how this works. Recheck here in several months.

## 2011-07-23 NOTE — Patient Instructions (Signed)
Take one at bedtime for the restless legs and let me know if it works. If it doesn't work we will increase the dosing

## 2011-07-31 NOTE — Progress Notes (Signed)
Pt coming in next Thurs to discuss labs

## 2011-08-06 ENCOUNTER — Institutional Professional Consult (permissible substitution): Payer: 59 | Admitting: Family Medicine

## 2011-08-17 ENCOUNTER — Encounter (HOSPITAL_COMMUNITY): Payer: Self-pay | Admitting: Emergency Medicine

## 2011-08-17 ENCOUNTER — Emergency Department (HOSPITAL_COMMUNITY)
Admission: EM | Admit: 2011-08-17 | Discharge: 2011-08-18 | Disposition: A | Discharge status: Home / Self Care | Payer: 59 | Attending: Emergency Medicine | Admitting: Emergency Medicine

## 2011-08-17 DIAGNOSIS — I10 Essential (primary) hypertension: Secondary | ICD-10-CM | POA: Insufficient documentation

## 2011-08-17 DIAGNOSIS — E119 Type 2 diabetes mellitus without complications: Secondary | ICD-10-CM | POA: Insufficient documentation

## 2011-08-17 DIAGNOSIS — M25539 Pain in unspecified wrist: Secondary | ICD-10-CM | POA: Insufficient documentation

## 2011-08-17 DIAGNOSIS — M5030 Other cervical disc degeneration, unspecified cervical region: Secondary | ICD-10-CM | POA: Insufficient documentation

## 2011-08-17 DIAGNOSIS — R209 Unspecified disturbances of skin sensation: Secondary | ICD-10-CM | POA: Insufficient documentation

## 2011-08-17 DIAGNOSIS — E785 Hyperlipidemia, unspecified: Secondary | ICD-10-CM | POA: Insufficient documentation

## 2011-08-17 NOTE — ED Notes (Addendum)
PT reports right hand numb when she woke up this morning. Left shoulder also numb but has been going on for 4-5 months. Pt has no other symptoms; had carpal tunnel release surgery on right side 8 years ago.

## 2011-08-18 ENCOUNTER — Emergency Department (HOSPITAL_COMMUNITY): Payer: 59

## 2011-08-18 MED ORDER — HYDROCODONE-ACETAMINOPHEN 5-500 MG PO TABS: 1.0000 | ORAL_TABLET | Freq: Four times a day (QID) | ORAL | Status: AC | PRN

## 2011-08-18 MED ORDER — HYDROCODONE-ACETAMINOPHEN 5-325 MG PO TABS
1.0000 | ORAL_TABLET | Freq: Once | ORAL | Status: AC
  Administered 2011-08-18: 1 via ORAL
  Filled 2011-08-18: qty 1

## 2011-08-18 NOTE — ED Notes (Signed)
RETURNED FROM X-RAY

## 2011-08-18 NOTE — ED Provider Notes (Signed)
Medical screening examination/treatment/procedure(s) were performed by non-physician practitioner and as supervising physician I was immediately available for consultation/collaboration.  Baudelio Karnes, MD 08/18/11 0640 

## 2011-08-18 NOTE — Discharge Instructions (Signed)
Degenerative Disc Disease Degenerative disc disease is a condition caused by the changes that occur in the cushions of the backbone (spinal discs) as you grow older. Spinal discs are soft and compressible discs located between the bones of the spine (vertebrae). They act like shock absorbers. Degenerative disc disease can affect the wholespine. However, the neck and lower back are most commonly affected. Many changes can occur in the spinal discs with aging, such as:  The spinal discs may dry and shrink.   Small tears may occur in the tough, outer covering of the disc (annulus).   The disc space may become smaller due to loss of water.   Abnormal growths in the bone (spurs) may occur. This can put pressure on the nerve roots exiting the spinal canal, causing pain.   The spinal canal may become narrowed.  CAUSES  Degenerative disc disease is a condition caused by the changes that occur in the spinal discs with aging. The exact cause is not known, but there is a genetic basis for many patients. Degenerative changes can occur due to loss of fluid in the disc. This makes the disc thinner and reduces the space between the backbones. Small cracks can develop in the outer layer of the disc. This can lead to the breakdown of the disc. You are more likely to get degenerative disc disease if you are overweight. Smoking cigarettes and doing heavy work such as weightlifting can also increase your risk of this condition. Degenerative changes can start after a sudden injury. Growth of bone spurs can compress the nerve roots and cause pain.  SYMPTOMS  The symptoms vary from person to person. Some people may have no pain, while others have severe pain. The pain may be so severe that it can limit your activities. The location of the pain depends on the part of your backbone that is affected. You will have neck or arm pain if a disc in the neck area is affected. You will have pain in your back, buttocks, or legs if a  disc in the lower back is affected. The pain becomes worse while bending, reaching up, or with twisting movements. The pain may start gradually and then get worse as time passes. It may also start after a major or minor injury. You may feel numbness or tingling in the arms or legs.  DIAGNOSIS  Your caregiver will ask you about your symptoms and about activities or habits that may cause the pain. He or she may also ask about any injuries, diseases, ortreatments you have had earlier. Your caregiver will examine you to check for the range of movement that is possible in the affected area, to check for strength in your extremities, and to check for sensation in the areas of the arms and legs supplied by different nerve roots. An X-ray of the spine may be taken. Your caregiver may suggest other imaging tests, such as a computerized magnetic scan (MRI), if needed.  TREATMENT  Treatment includes rest, modifying your activities, and applying ice and heat. Your caregiver may prescribe medicines to reduce your pain and may ask you to do some exercises to strengthen your back. In some cases, you may need surgery. You and your caregiver will decide on the treatment that is best for you. HOME CARE INSTRUCTIONS   Follow proper lifting and walking techniques as advised by your caregiver.   Maintain good posture.   Exercise regularly as advised.   Perform relaxation exercises.   Change your sitting,   standing, and sleeping habits as advised. Change positions frequently.   Lose weight as advised.   Stop smoking if you smoke.   Wear supportive footwear.  SEEK MEDICAL CARE IF:  The pain does not go away within 1 to 4 weeks. SEEK IMMEDIATE MEDICAL CARE IF:   The pain is severe.   You notice weakness in your arms, hands, or legs.   You begin to lose control of your bladder or bowel.  MAKE SURE YOU:   Understand these instructions.   Will watch your condition.   Will get help right away if you are not  doing well or get worse.  Document Released: 02/01/2007 Document Revised: 03/26/2011 Document Reviewed: 02/01/2007 [facility] Patient Information 2012 [place], [place]. Your x-ray shows it to have extensive degenerative disc disease in your cervical spine you have been given a narcotic to control your symptoms please make an appointment with Dr. Magnus Ivan for further evaluation.  Perhaps physical therapy.

## 2011-08-18 NOTE — ED Provider Notes (Signed)
History     CSN: [id number]  Arrival date & time 08/17/11  2306   First MD Initiated Contact with Patient 08/18/11 0054      Chief Complaint  Patient presents with  . Numbness    (Consider location/radiation/quality/duration/timing/severity/associated sxs/prior treatment) HPI Comments: Patient has had intermittent numbness and tingling of her left arm for the past 4-5 months.  Denies any trauma or injury.  She also reports that she has chronic pain in her right wrist for the past year to 2 years.  She has a history of having a carpal tunnel release on her right wrist 8 years ago  The history is provided by the patient.    Past Medical History  Diagnosis Date  . Diabetes mellitus   . Hypertension   . Hyperlipidemia     Past Surgical History  Procedure Date  . Carpal tunnel release     No family history on file.  History  Substance Use Topics  . Smoking status: Never Smoker   . Smokeless tobacco: Never Used  . Alcohol Use: No    OB History    Grav Para Term Preterm Abortions TAB SAB Ect Mult Living                  Review of Systems  Constitutional: Negative for fever and chills.  Musculoskeletal: Negative for back pain and joint swelling.  Neurological: Positive for numbness. Negative for weakness and headaches.    Allergies  Review of patient's allergies indicates no known allergies.  Home Medications   Current Outpatient Rx  Name Route Sig Dispense Refill  . LISINOPRIL-HYDROCHLOROTHIAZIDE 10-12.5 MG PO TABS Oral Take 1 tablet by mouth daily.    Marland Kitchen PIOGLITAZONE HCL-METFORMIN HCL 15-850 MG PO TABS Oral Take 1 tablet by mouth 2 (two) times daily with a meal.    . ROSUVASTATIN CALCIUM 40 MG PO TABS Oral Take 40 mg by mouth daily.    . TRAMADOL HCL 50 MG PO TABS Oral Take 50 mg by mouth every 6 (six) hours as needed. For pain.    Marland Kitchen HYDROCODONE-ACETAMINOPHEN 5-500 MG PO TABS Oral Take 1-2 tablets by mouth every 6 (six) hours as needed for pain. 15 tablet 0     BP 132/85  Pulse 82  Temp(Src) 97.4 F (36.3 C) (Oral)  Resp 18  SpO2 100%  Physical Exam  Constitutional: She is oriented to person, place, and time. She appears well-developed and well-nourished.  Eyes: Pupils are equal, round, and reactive to light.  Neck: Spinous process tenderness present. No muscular tenderness present.    Cardiovascular: Normal rate.   Pulmonary/Chest: Effort normal.  Musculoskeletal: Normal range of motion. She exhibits tenderness.       Right wrist: She exhibits tenderness. She exhibits normal range of motion, no bony tenderness, no swelling, no effusion, no crepitus and no deformity.  Neurological: She is alert and oriented to person, place, and time.  Skin: Skin is warm and dry.    ED Course  Procedures (including critical care time)  Labs Reviewed - No data to display Dg Cervical Spine Complete  08/18/2011  *RADIOLOGY REPORT*  Clinical Data: Numbness radiating down right arm for 4-5 months.  CERVICAL SPINE - COMPLETE 4+ VIEW  Comparison: Cervical spine radiographs - 03/15/2011  Findings:  C1 to the superior endplate of T1 is visualized on the lateral radiograph.  No normal alignment of the cervical spine.  No anterolisthesis or retrolisthesis.  The dens is normally positioned between the lateral  masses of C1.  Vertebral body heights are preserved.  The prevertebral soft tissues are normal.  Redemonstrated mild to moderate multilevel cervical spine DDD, worse at C3 - C4, C4 - C5 andC5 - C6 with disc space height loss, endplate irregularity and primarily anteriorly directed osteophytosis.  Bilateral neural foramina appear patent.  Regional soft tissues are normal.  Limited visualization of lung apices is normal.  IMPRESSION: Grossly unchanged mild to moderate multilevel DDD, worse at C3 - C4, C4 - C5 and C5 - C6.  Original Report Authenticated By: Waynard Reeds, M.D.   Dg Wrist Complete Right  08/18/2011  *RADIOLOGY REPORT*  Clinical Data: Pain in right  wrist, evaluate for arthritis  RIGHT WRIST - COMPLETE 3+ VIEW  Comparison: None.  Findings: No definite fracture or dislocation though the lateral radiograph is obscured due to obliquity.  Joint spaces are grossly preserved.  There is no definite displacement of the pronator quadratus fat pad.  No definite evidence of chondrocalcinosis. Regional soft tissues are normal.  IMPRESSION: 1.  No fracture.  If the patient has pain referable to the anatomic snuff box, splinting and a follow-up radiograph in 10 to 14 days is recommended to evaluate for occult scaphoid fracture. 2.  No significant degenerative change of the right wrist.  Original Report Authenticated By: Waynard Reeds, M.D.     1. Degenerative disc disease, cervical   2. Wrist pain       MDM  I feel that she has some arthritis in her right wrist over x-ray for confirmation.  She does have point tenderness over the C7-T1 joint.  When these areas palpated.  She does have numbness and tingling radiates to her left arm.  Feel that she may have a pinched nerve.  We'll obtain C-spine series to assess disc height.  Treat with steroids and refer to her orthopedic surgeon, Dr. Magnus Ivan for further treatment        Arman Filter, NP 08/18/11 [id number]  Arman Filter, NP 08/18/11 [phone]

## 2011-11-23 ENCOUNTER — Encounter: Payer: Self-pay | Admitting: Internal Medicine

## 2011-11-25 ENCOUNTER — Ambulatory Visit: Payer: 59 | Admitting: Family Medicine

## 2011-11-26 ENCOUNTER — Ambulatory Visit: Payer: 59 | Admitting: Family Medicine

## 2011-12-01 ENCOUNTER — Encounter: Payer: Self-pay | Admitting: Family Medicine

## 2011-12-01 ENCOUNTER — Ambulatory Visit (INDEPENDENT_AMBULATORY_CARE_PROVIDER_SITE_OTHER): Payer: 59 | Admitting: Family Medicine

## 2011-12-01 VITALS — BP 116/70 | HR 90 | Wt 182.0 lb

## 2011-12-01 DIAGNOSIS — Z79899 Other long term (current) drug therapy: Secondary | ICD-10-CM

## 2011-12-01 DIAGNOSIS — E1159 Type 2 diabetes mellitus with other circulatory complications: Secondary | ICD-10-CM

## 2011-12-01 DIAGNOSIS — Z23 Encounter for immunization: Secondary | ICD-10-CM

## 2011-12-01 DIAGNOSIS — I10 Essential (primary) hypertension: Secondary | ICD-10-CM | POA: Insufficient documentation

## 2011-12-01 DIAGNOSIS — E1169 Type 2 diabetes mellitus with other specified complication: Secondary | ICD-10-CM

## 2011-12-01 DIAGNOSIS — E119 Type 2 diabetes mellitus without complications: Secondary | ICD-10-CM

## 2011-12-01 DIAGNOSIS — E785 Hyperlipidemia, unspecified: Secondary | ICD-10-CM

## 2011-12-01 DIAGNOSIS — E669 Obesity, unspecified: Secondary | ICD-10-CM | POA: Insufficient documentation

## 2011-12-01 LAB — LIPID PANEL
HDL: 48 mg/dL (ref >39)
LDL Cholesterol: 116 mg/dL — ABNORMAL HIGH (ref 0–99)
Total CHOL/HDL Ratio: 4.3 Ratio
Triglycerides: 218 mg/dL — ABNORMAL HIGH (ref <150)

## 2011-12-01 NOTE — Progress Notes (Signed)
  Subjective:    Patient ID: Deborah Camacho, female    DOB: 08/03/1955, 56 y.o.   MRN: [id number]  HPI She is here for a recheck. She recently had carpal, surgery. She is out of work due to this. In the past had been cared for through [facility]. She admits to not exercising and eating anything she wanted. She has gained some weight since her last visit. She does check her feet periodically and has had an eye exam in the last year. Her social history was reviewed.   Review of Systems     Objective:   Physical Exam Alert and in no distress. Hemoglobin A1c is 7.1       Assessment & Plan:   1. Diabetes mellitus  POCT glycosylated hemoglobin (Hb A1C)  2. Hyperlipidemia LDL goal <70  Lipid panel  3. Hypertension associated with diabetes    4. Obesity (BMI 30-39.9)    5. Encounter for long-term (current) use of other medications  HM COLONOSCOPY, Tdap vaccine greater than or equal to 7yo IM   discussed the need for her to take better care of herself in regard to diet and exercise. Will also set her up for colonoscopy and get her immunizations up to date.

## 2012-01-15 ENCOUNTER — Ambulatory Visit (INDEPENDENT_AMBULATORY_CARE_PROVIDER_SITE_OTHER): Payer: 59 | Admitting: Medical

## 2012-01-15 ENCOUNTER — Encounter: Payer: Self-pay | Admitting: Medical

## 2012-01-15 VITALS — BP 122/80 | HR 84 | Temp 98.2°F | Resp 16 | Wt 183.0 lb

## 2012-01-15 DIAGNOSIS — E119 Type 2 diabetes mellitus without complications: Secondary | ICD-10-CM

## 2012-01-15 DIAGNOSIS — E785 Hyperlipidemia, unspecified: Secondary | ICD-10-CM

## 2012-01-15 DIAGNOSIS — I10 Essential (primary) hypertension: Secondary | ICD-10-CM

## 2012-01-15 DIAGNOSIS — Z23 Encounter for immunization: Secondary | ICD-10-CM

## 2012-01-15 MED ORDER — ROSUVASTATIN CALCIUM 40 MG PO TABS: 40.0000 mg | ORAL_TABLET | Freq: Every day | ORAL | Status: DC

## 2012-01-16 ENCOUNTER — Encounter: Payer: Self-pay | Admitting: Medical

## 2012-01-16 NOTE — Progress Notes (Signed)
  Subjective:    Patient ID: Deborah Camacho, female    DOB: [birth year], 56 y.o.   MRN: [id number]  HPI She is here for biometric form completion for work.  She was just here recently for routine diabetic f/u. She recently had carpal, surgery. She is out of work due to this.  She is currently not exercising.   She does try to eat healthy.  She does check her feet periodically and has had an eye exam in the last year.   She is compliant with medications.  No new c/o.       Objective:   Physical Exam  Filed Vitals:   01/15/12 1044  BP: 122/80  Pulse: 84  Temp: 98.2 F (36.8 C)  Resp: 16    General appearance: alert, no distress, WD/WN  Neck: supple, no lymphadenopathy, no thyromegaly, no masses Heart: RRR, normal S1, S2, no murmurs Lungs: CTA bilaterally, no wheezes, rhonchi, or rales Abdomen: +bs, soft, non tender, non distended, no masses, no hepatomegaly, no splenomegaly Pulses: 2+ symmetric, upper and lower extremities, normal cap refill     Assessment & Plan:   1. Type II or unspecified type diabetes mellitus without mention of complication, not stated as uncontrolled    2. Essential hypertension, benign    3. Hyperlipidemia    4. Need for influenza vaccination  Flu vaccine greater than or equal to 3yo preservative free IM  5. Need for pneumococcal vaccination  Pneumococcal polysaccharide vaccine 23-valent greater than or equal to 2yo subcutaneous/IM    Biometric form completed, discussed need for daily exercise, weight loss, and c/t current medications.   She is past due for preventative screening.  Advised she return soon in 3-4 mo for routine f/u and physical to include updating mammogram, colonoscopy as well as routine preventative care.   VIS and vaccine counseling and vaccines given or flu and pneumococcal today.

## 2012-04-01 ENCOUNTER — Ambulatory Visit: Payer: 59 | Admitting: Family Medicine

## 2012-05-05 ENCOUNTER — Encounter: Payer: Self-pay | Admitting: Family Medicine

## 2012-05-05 ENCOUNTER — Ambulatory Visit (INDEPENDENT_AMBULATORY_CARE_PROVIDER_SITE_OTHER): Payer: Self-pay | Admitting: Family Medicine

## 2012-05-05 VITALS — BP 140/90 | HR 94 | Ht 63.0 in | Wt 181.0 lb

## 2012-05-05 DIAGNOSIS — E119 Type 2 diabetes mellitus without complications: Secondary | ICD-10-CM

## 2012-05-05 DIAGNOSIS — Z9119 Patient's noncompliance with other medical treatment and regimen: Secondary | ICD-10-CM

## 2012-05-05 DIAGNOSIS — E1169 Type 2 diabetes mellitus with other specified complication: Secondary | ICD-10-CM

## 2012-05-05 DIAGNOSIS — E669 Obesity, unspecified: Secondary | ICD-10-CM

## 2012-05-05 DIAGNOSIS — E1159 Type 2 diabetes mellitus with other circulatory complications: Secondary | ICD-10-CM

## 2012-05-05 DIAGNOSIS — I10 Essential (primary) hypertension: Secondary | ICD-10-CM

## 2012-05-05 LAB — POCT GLYCOSYLATED HEMOGLOBIN (HGB A1C): Hemoglobin A1C: 8

## 2012-05-05 MED ORDER — PIOGLITAZONE HCL-METFORMIN HCL 15-850 MG PO TABS: 1.0000 | ORAL_TABLET | Freq: Two times a day (BID) | ORAL | Status: DC

## 2012-05-05 MED ORDER — LISINOPRIL-HYDROCHLOROTHIAZIDE 10-12.5 MG PO TABS
1.0000 | ORAL_TABLET | Freq: Every day | ORAL | Status: DC
Start: 2012-05-05 — End: 2013-08-20

## 2012-05-05 NOTE — Progress Notes (Signed)
  Subjective:    Patient ID: Deborah Camacho, female    DOB: 1955/08/11, 57 y.o.   MRN: [id number]  HPI She is here for recheck. She is still having difficulty with her left hand and apparently has an appointment to see her orthopedic surgeon. She apparently lost her job because of this. She is in the process of applying for disability because of this however the she then mentioned leg pain and arthritis is another reason. She apparently did any physical therapy. She has been noncompliant with her diabetes care. She has not been checking her blood sugars stating she did not have a machine. She has stopped her Actos 2 weeks ago because she ran out. She states that she is walking. Her medications were reviewed. Social history was also reviewed.   Review of Systems     Objective:   Physical Exam Alert and in no distress. Hemoglobin A1c is 8.0      Assessment & Plan:   1. Diabetes mellitus  POCT glycosylated hemoglobin (Hb A1C), pioglitazone-metformin (ACTOPLUS MET) 15-850 MG per tablet  2. Hypertension associated with diabetes  lisinopril-hydrochlorothiazide (PRINZIDE,ZESTORETIC) 10-12.5 MG per tablet  3. Obesity (BMI 30-39.9)    4. Personal history of noncompliance with medical treatment, presenting hazards to health      encouraged her to remain as physically active as possible. Encouraged her to followup with her orthopedic surgeon explained that there are things that can be done to help with her hands. Discussed the fact that her arthritis would be greatly improved with weight loss and that this would also help with her diabetes. Tried to explain to her that disability and inactivity would be the worst thing she could do.

## 2012-09-06 ENCOUNTER — Ambulatory Visit: Payer: Self-pay | Admitting: Family Medicine

## 2012-09-14 ENCOUNTER — Ambulatory Visit: Payer: Self-pay | Admitting: Family Medicine

## 2012-09-19 ENCOUNTER — Telehealth: Payer: Self-pay | Admitting: Internal Medicine

## 2012-09-19 NOTE — Telephone Encounter (Signed)
Faxed over medical records to [employer]ability Determination services

## 2012-10-04 ENCOUNTER — Encounter: Payer: Self-pay | Admitting: Family Medicine

## 2012-10-07 DIAGNOSIS — Z0289 Encounter for other administrative examinations: Secondary | ICD-10-CM

## 2013-08-20 ENCOUNTER — Emergency Department (HOSPITAL_COMMUNITY)
Admission: EM | Admit: 2013-08-20 | Discharge: 2013-08-20 | Disposition: A | Discharge status: Home / Self Care | Payer: Self-pay | Attending: Emergency Medicine | Admitting: Emergency Medicine

## 2013-08-20 ENCOUNTER — Encounter (HOSPITAL_COMMUNITY): Payer: Self-pay | Admitting: Emergency Medicine

## 2013-08-20 DIAGNOSIS — I10 Essential (primary) hypertension: Secondary | ICD-10-CM | POA: Insufficient documentation

## 2013-08-20 DIAGNOSIS — M1990 Unspecified osteoarthritis, unspecified site: Secondary | ICD-10-CM

## 2013-08-20 DIAGNOSIS — E119 Type 2 diabetes mellitus without complications: Secondary | ICD-10-CM | POA: Insufficient documentation

## 2013-08-20 DIAGNOSIS — Z79899 Other long term (current) drug therapy: Secondary | ICD-10-CM | POA: Insufficient documentation

## 2013-08-20 DIAGNOSIS — M1710 Unilateral primary osteoarthritis, unspecified knee: Secondary | ICD-10-CM | POA: Insufficient documentation

## 2013-08-20 DIAGNOSIS — IMO0002 Reserved for concepts with insufficient information to code with codable children: Secondary | ICD-10-CM | POA: Insufficient documentation

## 2013-08-20 DIAGNOSIS — E785 Hyperlipidemia, unspecified: Secondary | ICD-10-CM | POA: Insufficient documentation

## 2013-08-20 MED ORDER — TRAMADOL HCL 50 MG PO TABS
50.0000 mg | ORAL_TABLET | Freq: Once | ORAL | Status: AC
  Administered 2013-08-20: 50 mg via ORAL
  Filled 2013-08-20: qty 1

## 2013-08-20 MED ORDER — TRAMADOL HCL 50 MG PO TABS: 50.0000 mg | ORAL_TABLET | Freq: Four times a day (QID) | ORAL | Status: DC | PRN

## 2013-08-20 NOTE — ED Notes (Signed)
Pt is c/o right leg pain that started on Friday  Pt has hx of arthritis  Pt states the pain is in her knee going to the lower leg

## 2013-08-20 NOTE — ED Notes (Addendum)
Pt c/o 8/10 pain in both legs, "especially the knees." x 2 days. Pt has hx of arthritis. Pt. sts that she normally takes tramadol to relieve the pain but that she hasn't "seen the doctor in a while" and has no medication to take. Pt A&Ox4. NAD noted. Pt rubbing knees with hands. Pt denies swelling or deformity, injury.

## 2013-08-20 NOTE — ED Provider Notes (Signed)
CSN: [id number]     Arrival date & time 08/20/13  0152 History   First MD Initiated Contact with Patient 08/20/13 0408     Chief Complaint  Patient presents with  . Leg Pain     (Consider location/radiation/quality/duration/timing/severity/associated sxs/prior Treatment) HPI History provided by patient. Right knee pain worse over the last week after she ran out of tramadol. She has been diagnosed with arthritis by her orthopedic surgeon. She had been taking tramadol until a week ago with good pain control. Now having moderate to severe pain, keeping her up at night. No fevers or chills. No fall or trauma. No rash. Pain is anterior, mostly affecting right knee. Has some pain in her left knee. No difficulty walking. Patient declines any other narcotic pain medication. No new swelling.   Past Medical History  Diagnosis Date  . Diabetes mellitus   . Hypertension   . Hyperlipidemia   . Arthritis   . Dyslipidemia   . Vitamin D deficiency    Past Surgical History  Procedure Laterality Date  . Carpal tunnel release     Family History  Problem Relation Age of Onset  . Hypertension Other   . Cancer Other    History  Substance Use Topics  . Smoking status: Never Smoker   . Smokeless tobacco: Never Used  . Alcohol Use: No   OB History   Grav Para Term Preterm Abortions TAB SAB Ect Mult Living                 Review of Systems  Constitutional: Negative for fever and chills.  Respiratory: Negative for shortness of breath.   Cardiovascular: Negative for chest pain.  Gastrointestinal: Negative for abdominal pain.  Genitourinary: Negative for flank pain.  Musculoskeletal: Negative for back pain and neck pain.  Skin: Negative for rash.  Neurological: Negative for weakness and numbness.  All other systems reviewed and are negative.     Allergies  Review of patient's allergies indicates no known allergies.  Home Medications   Prior to Admission medications   Medication Sig  Start Date End Date Taking? Authorizing Provider  lisinopril-hydrochlorothiazide (PRINZIDE,ZESTORETIC) 10-12.5 MG per tablet Take 1 tablet by mouth daily.   Yes Historical Provider, MD  pioglitazone-metformin (ACTOPLUS MET) 15-850 MG per tablet Take 1 tablet by mouth 2 (two) times daily with a meal.   Yes Historical Provider, MD  rosuvastatin (CRESTOR) 40 MG tablet Take 1 tablet (40 mg total) by mouth daily. 01/15/12  Yes Camelia Eng Tysinger, PA-C  traMADol (ULTRAM) 50 MG tablet Take 50 mg by mouth every 6 (six) hours as needed. For pain.   Yes Historical Provider, MD   BP 187/81  Pulse 107  Temp(Src) 97.8 F (36.6 C) (Oral)  Resp 20  Ht _0  (1.626 m)  SpO2 100% Physical Exam  Constitutional: She is oriented to person, place, and time. She appears well-developed and well-nourished.  HENT:  Head: Normocephalic and atraumatic.  Eyes: EOM are normal. Pupils are equal, round, and reactive to light.  Neck: Neck supple.  Cardiovascular: Normal rate, regular rhythm and intact distal pulses.   Pulmonary/Chest: Effort normal and breath sounds normal. No respiratory distress.  Musculoskeletal: Normal range of motion.  Tenderness over right knee with mild effusion. No erythema or increased warmth to touch. Good range of motion with distal neurovascular intact x4. Minimal tenderness over the left knee. No calf tenderness. No cords.  Neurological: She is alert and oriented to person, place, and time.  Skin:  Skin is warm and dry.    ED Course  Procedures (including critical care time) Tramadol provided  Plan prescription for tramadol and followup with orthopedic surgeon. Patient agrees to strict return precautions. Arthritis instructions provided. No clinical DVT. No cellulitis. No indication for imaging or blood work at this time.   MDM   Diagnosis: Right knee pain, history of arthritis  Presentation suggests osteoarthritis. Pain medications provided. vital signs and nursing notes reviewed and  considered.  Teressa Lower, MD 08/20/13 [phone]

## 2013-08-20 NOTE — Discharge Instructions (Signed)
Osteoarthritis Osteoarthritis is a disease that causes soreness and swelling (inflammation) of a joint. It occurs when the cartilage at the affected joint wears down. Cartilage acts as a cushion, covering the ends of bones where they meet to form a joint. Osteoarthritis is the most common form of arthritis. It often occurs in older people. The joints affected most often by this condition include those in the:  Ends of the fingers.  Thumbs.  Neck.  Lower back.  Knees.  Hips. CAUSES  Over time, the cartilage that covers the ends of bones begins to wear away. This causes bone to rub on bone, producing pain and stiffness in the affected joints.  RISK FACTORS Certain factors can increase your chances of having osteoarthritis, including:  Older age.  Excessive body weight.  Overuse of joints. SIGNS AND SYMPTOMS   Pain, swelling, and stiffness in the joint.  Over time, the joint may lose its normal shape.  Small deposits of bone (osteophytes) may grow on the edges of the joint.  Bits of bone or cartilage can break off and float inside the joint space. This may cause more pain and damage. DIAGNOSIS  Your health care provider will do a physical exam and ask about your symptoms. Various tests may be ordered, such as:  X-rays of the affected joint.  An MRI scan.  Blood tests to rule out other types of arthritis.  Joint fluid tests. This involves using a needle to draw fluid from the joint and examining the fluid under a microscope. TREATMENT  Goals of treatment are to control pain and improve joint function. Treatment plans may include:  A prescribed exercise program that allows for rest and joint relief.  A weight control plan.  Pain relief techniques, such as:  Properly applied heat and cold.  Electric pulses delivered to nerve endings under the skin (transcutaneous electrical nerve stimulation, TENS).  Massage.  Certain nutritional supplements.  Medicines to  control pain, such as:  Acetaminophen.  Nonsteroidal anti-inflammatory drugs (NSAIDs), such as naproxen.  Narcotic or central-acting agents, such as tramadol.  Corticosteroids. These can be given orally or as an injection.  Surgery to reposition the bones and relieve pain (osteotomy) or to remove loose pieces of bone and cartilage. Joint replacement may be needed in advanced states of osteoarthritis. HOME CARE INSTRUCTIONS   Only take over-the-counter or prescription medicines as directed by your health care provider. Take all medicines exactly as instructed.  Maintain a healthy weight. Follow your health care provider's instructions for weight control. This may include dietary instructions.  Exercise as directed. Your health care provider can recommend specific types of exercise. These may include:  Strengthening exercises These are done to strengthen the muscles that support joints affected by arthritis. They can be performed with weights or with exercise bands to add resistance.  Aerobic activities These are exercises, such as brisk walking or low-impact aerobics, that get your heart pumping.  Range-of-motion activities These keep your joints limber.  Balance and agility exercises These help you maintain daily living skills.  Rest your affected joints as directed by your health care provider.  Follow up with your health care provider as directed. SEEK MEDICAL CARE IF:   Your skin turns red.  You develop a rash in addition to your joint pain.  You have worsening joint pain. SEEK IMMEDIATE MEDICAL CARE IF:  You have a significant loss of weight or appetite.  You have a fever along with joint or muscle aches.  You have   night sweats. FOR MORE INFORMATION  National Institute of Arthritis and Musculoskeletal and Skin Diseases: www.niams.nih.gov National Institute on Aging: www.nia.nih.gov American College of Rheumatology: www.rheumatology.org Document Released: 04/06/2005  Document Revised: 01/25/2013 Document Reviewed: 12/12/2012 [employer] Patient Information 2014 [employer].  

## 2014-03-29 ENCOUNTER — Other Ambulatory Visit: Payer: Self-pay | Admitting: Specialist

## 2014-03-29 ENCOUNTER — Other Ambulatory Visit: Payer: Self-pay

## 2014-03-29 DIAGNOSIS — Z1231 Encounter for screening mammogram for malignant neoplasm of breast: Secondary | ICD-10-CM

## 2014-04-11 ENCOUNTER — Ambulatory Visit: Payer: Self-pay

## 2014-04-19 ENCOUNTER — Ambulatory Visit
Admission: RE | Admit: 2014-04-19 | Discharge: 2014-04-19 | Disposition: A | Discharge status: Home / Self Care | Payer: Medicare Other | Source: Ambulatory Visit | Attending: Specialist | Admitting: Specialist

## 2014-04-19 DIAGNOSIS — Z1231 Encounter for screening mammogram for malignant neoplasm of breast: Secondary | ICD-10-CM

## 2015-06-18 ENCOUNTER — Other Ambulatory Visit: Payer: Self-pay

## 2015-06-18 DIAGNOSIS — Z1231 Encounter for screening mammogram for malignant neoplasm of breast: Secondary | ICD-10-CM

## 2015-07-02 ENCOUNTER — Ambulatory Visit: Payer: Medicare Other

## 2015-08-15 ENCOUNTER — Ambulatory Visit: Payer: Medicare Other

## 2015-10-23 DIAGNOSIS — I158 Other secondary hypertension: Secondary | ICD-10-CM | POA: Diagnosis not present

## 2015-10-23 DIAGNOSIS — E785 Hyperlipidemia, unspecified: Secondary | ICD-10-CM | POA: Diagnosis not present

## 2015-10-23 DIAGNOSIS — M545 Low back pain: Secondary | ICD-10-CM | POA: Diagnosis not present

## 2015-10-23 DIAGNOSIS — E0800 Diabetes mellitus due to underlying condition with hyperosmolarity without nonketotic hyperglycemic-hyperosmolar coma (NKHHC): Secondary | ICD-10-CM | POA: Diagnosis not present

## 2016-05-01 DIAGNOSIS — E785 Hyperlipidemia, unspecified: Secondary | ICD-10-CM | POA: Diagnosis not present

## 2016-05-01 DIAGNOSIS — I158 Other secondary hypertension: Secondary | ICD-10-CM | POA: Diagnosis not present

## 2016-05-01 DIAGNOSIS — M545 Low back pain: Secondary | ICD-10-CM | POA: Diagnosis not present

## 2016-05-01 DIAGNOSIS — E0800 Diabetes mellitus due to underlying condition with hyperosmolarity without nonketotic hyperglycemic-hyperosmolar coma (NKHHC): Secondary | ICD-10-CM | POA: Diagnosis not present

## 2016-07-29 ENCOUNTER — Telehealth: Payer: Self-pay | Admitting: General Practice

## 2016-07-29 NOTE — Telephone Encounter (Signed)
2nd attempt (1st attempt was 06/17/16) to contact patient and confirm PCP.  There was no answer and no option to leave a voicemail message. I'll try again later. Deborah Camacho ([place])

## 2016-08-07 NOTE — Telephone Encounter (Signed)
3rd attempt to contact the patient and confirm PCP.  There was no answer and no option to leave a voicemail message. Deborah Camacho ([place])

## 2016-08-07 NOTE — Telephone Encounter (Signed)
I spoke with the patient's daughter Nuala Alpha, who provided me with the patient's new number [phone]).  I then called the patient who stated that she hasn't seen Dr. Redmond School in a while.  She now sees Dr. Ricke Hey on [facility] as her PCP. Duane Lope ([place])

## 2016-08-19 ENCOUNTER — Other Ambulatory Visit: Payer: Self-pay | Admitting: Pharmacy Technician

## 2016-08-19 NOTE — Patient Outreach (Signed)
[place] [place] [facility]) Care Management  08/19/2016  Deborah Camacho 1955/07/28 [id number]   Spoke to patient in regards to PIOGLITAZONE HCL-METFORMIN HCL TAB 15-850 MG Medication Adherence. Patient states she is unclear with whether she is still supposed to be taking medication. I offered to contact Dr. Ricke Hey to verify and to have dispensed as a 3 month script. Dr. Noland Fordyce office states patient would need to be seen. I informed patient.  [name] [place], [place] Management [phone]

## 2016-10-12 ENCOUNTER — Other Ambulatory Visit: Payer: Self-pay | Admitting: Internal Medicine

## 2016-10-12 DIAGNOSIS — Z1231 Encounter for screening mammogram for malignant neoplasm of breast: Secondary | ICD-10-CM

## 2016-10-27 ENCOUNTER — Ambulatory Visit
Admission: RE | Admit: 2016-10-27 | Discharge: 2016-10-27 | Disposition: A | Discharge status: Home / Self Care | Payer: Medicare Other | Source: Ambulatory Visit | Attending: Internal Medicine | Admitting: Internal Medicine

## 2016-10-27 DIAGNOSIS — Z1231 Encounter for screening mammogram for malignant neoplasm of breast: Secondary | ICD-10-CM

## 2017-02-09 ENCOUNTER — Ambulatory Visit: Payer: Self-pay | Admitting: Nurse Practitioner

## 2017-02-09 DIAGNOSIS — Z79891 Long term (current) use of opiate analgesic: Secondary | ICD-10-CM | POA: Diagnosis not present

## 2017-02-22 ENCOUNTER — Ambulatory Visit: Payer: Medicare Other | Admitting: Nurse Practitioner

## 2017-03-16 ENCOUNTER — Ambulatory Visit: Payer: Medicare Other | Admitting: Nurse Practitioner

## 2017-05-31 ENCOUNTER — Ambulatory Visit: Payer: Medicare Other | Admitting: Family Medicine

## 2017-07-01 DIAGNOSIS — I10 Essential (primary) hypertension: Secondary | ICD-10-CM | POA: Diagnosis not present

## 2017-07-01 DIAGNOSIS — E785 Hyperlipidemia, unspecified: Secondary | ICD-10-CM | POA: Diagnosis not present

## 2017-07-01 DIAGNOSIS — G894 Chronic pain syndrome: Secondary | ICD-10-CM | POA: Diagnosis not present

## 2017-07-01 DIAGNOSIS — E119 Type 2 diabetes mellitus without complications: Secondary | ICD-10-CM | POA: Diagnosis not present

## 2017-09-01 DIAGNOSIS — M25511 Pain in right shoulder: Secondary | ICD-10-CM | POA: Insufficient documentation

## 2017-10-20 ENCOUNTER — Telehealth: Payer: Self-pay | Admitting: *Deleted

## 2017-10-20 NOTE — Telephone Encounter (Signed)
Called and left Stacey at Dr. Kelby Fam office a message with the appt date/time for Monday

## 2017-10-25 ENCOUNTER — Encounter: Payer: Self-pay | Admitting: Gynecologic Oncology

## 2017-10-25 ENCOUNTER — Inpatient Hospital Stay: Payer: Medicare Other | Attending: Gynecologic Oncology | Admitting: Gynecologic Oncology

## 2017-10-25 ENCOUNTER — Inpatient Hospital Stay: Payer: Medicare Other

## 2017-10-25 VITALS — BP 137/95 | HR 101 | Temp 98.4°F | Resp 20 | Ht 63.0 in | Wt 162.2 lb

## 2017-10-25 DIAGNOSIS — E1142 Type 2 diabetes mellitus with diabetic polyneuropathy: Secondary | ICD-10-CM | POA: Diagnosis not present

## 2017-10-25 DIAGNOSIS — N8500 Endometrial hyperplasia, unspecified: Secondary | ICD-10-CM | POA: Diagnosis not present

## 2017-10-25 LAB — HEMOGLOBIN A1C
Hgb A1c MFr Bld: 9.3 % — ABNORMAL HIGH (ref 4.8–5.6)
MEAN PLASMA GLUCOSE: 220.21 mg/dL

## 2017-10-25 NOTE — Patient Instructions (Signed)
We will be checking a hemoglobin A1C today.                Preparing for your Surgery  Plan for surgery on November 04, 2017 with Dr. Everitt Amber at [place] will be scheduled for a robotic assisted total hysterectomy, bilateral salpingo-oophorectomy, sentinel lymph node biopsy.  Pre-operative Testing -You will receive a phone call from presurgical testing at [facility] to arrange for a pre-operative testing appointment before your surgery.  This appointment normally occurs one to two weeks before your scheduled surgery.   -Bring your insurance card, copy of an advanced directive if applicable, medication list  -At that visit, you will be asked to sign a consent for a possible blood transfusion in case a transfusion becomes necessary during surgery.  The need for a blood transfusion is rare but having consent is a necessary part of your care.     -You should not be taking blood thinners or aspirin at least ten days prior to surgery unless instructed by your surgeon.  Day Before Surgery at [place] will be asked to take in a light diet the day before surgery.  Avoid carbonated beverages.  You will be advised to have nothing to eat or drink after midnight the evening before.    Eat a light diet the day before surgery.  Examples including soups, broths, toast, yogurt, mashed potatoes.  Things to avoid include carbonated beverages (fizzy beverages), raw fruits and raw vegetables, or beans.   If your bowels are filled with gas, your surgeon will have difficulty visualizing your pelvic organs which increases your surgical risks.  Your role in recovery Your role is to become active as soon as directed by your doctor, while still giving yourself time to heal.  Rest when you feel tired. You will be asked to do the following in order to speed your recovery:  - Cough and breathe deeply. This helps toclear and expand your lungs and can prevent pneumonia. You may be given a  spirometer to practice deep breathing. A staff member will show you how to use the spirometer. - Do mild physical activity. Walking or moving your legs help your circulation and body functions return to normal. A staff member will help you when you try to walk and will provide you with simple exercises. Do not try to get up or walk alone the first time. - Actively manage your pain. Managing your pain lets you move in comfort. We will ask you to rate your pain on a scale of zero to 10. It is your responsibility to tell your doctor or nurse where and how much you hurt so your pain can be treated.  Special Considerations -If you are diabetic, you may be placed on insulin after surgery to have closer control over your blood sugars to promote healing and recovery.  This does not mean that you will be discharged on insulin.  If applicable, your oral antidiabetics will be resumed when you are tolerating a solid diet.  -Your final pathology results from surgery should be available by the Friday after surgery and the results will be relayed to you when available.  -Dr. Lahoma Crocker is the Surgeon that assists your GYN Oncologist with surgery.  The next day after your surgery you will either see your GYN Oncologist or Dr. Lahoma Crocker.   Blood Transfusion Information WHAT IS A BLOOD TRANSFUSION? A transfusion is the replacement of blood or some of its  parts. Blood is made up of multiple cells which provide different functions.  Red blood cells carry oxygen and are used for blood loss replacement.  White blood cells fight against infection.  Platelets control bleeding.  Plasma helps clot blood.  Other blood products are available for specialized needs, such as hemophilia or other clotting disorders. BEFORE THE TRANSFUSION  Who gives blood for transfusions?   You may be able to donate blood to be used at a later date on yourself (autologous donation).  Relatives can be asked to donate  blood. This is generally not any safer than if you have received blood from a stranger. The same precautions are taken to ensure safety when a relative's blood is donated.  Healthy volunteers who are fully evaluated to make sure their blood is safe. This is blood bank blood. Transfusion therapy is the safest it has ever been in the practice of medicine. Before blood is taken from a donor, a complete history is taken to make sure that person has no history of diseases nor engages in risky social behavior (examples are intravenous drug use or sexual activity with multiple partners). The donor's travel history is screened to minimize risk of transmitting infections, such as malaria. The donated blood is tested for signs of infectious diseases, such as HIV and hepatitis. The blood is then tested to be sure it is compatible with you in order to minimize the chance of a transfusion reaction. If you or a relative donates blood, this is often done in anticipation of surgery and is not appropriate for emergency situations. It takes many days to process the donated blood. RISKS AND COMPLICATIONS Although transfusion therapy is very safe and saves many lives, the main dangers of transfusion include:   Getting an infectious disease.  Developing a transfusion reaction. This is an allergic reaction to something in the blood you were given. Every precaution is taken to prevent this. The decision to have a blood transfusion has been considered carefully by your caregiver before blood is given. Blood is not given unless the benefits outweigh the risks.

## 2017-10-25 NOTE — H&P (View-Only) (Signed)
Consult Note: Gyn-Onc  Consult was requested by Dr. Deatra Ina for the evaluation of Deborah Camacho 62 y.o. female  CC:  Chief Complaint  Patient presents with  . Endometrial hyperplasia    Assessment/Plan:  [name]  is a 62 y.o.  year old with postmenopausal bleeding and complex atypical hyperplasia of the uterus. She also has type II diabetes mellitus.  A detailed discussion was held with the patient and her family with regard to to her atypical hyperplasia diagnosis and its association with cancer. We discussed that 40% of patients who had a preop diagnosis of endometrial hyperplasia will have cancer on final pathology. We discussed the standard management options for uterine cancer which includes surgery followed possibly by adjuvant therapy depending on the results of surgery. The options for surgical management include a hysterectomy and removal of the tubes and ovaries possibly with removal of pelvic and para-aortic lymph nodes.If feasible, a minimally invasive approach including a robotic hysterectomy or laparoscopic hysterectomy have benefits including shorter hospital stay, recovery time and better wound healing than with open surgery. The patient has been counseled about these surgical options and the risks of surgery in general including infection, bleeding, damage to surrounding structures (including bowel, bladder, ureters, nerves or vessels), and the postoperative risks of PE/ DVT, and lymphedema. I extensively reviewed the additional risks of robotic hysterectomy including possible need for conversion to open laparotomy.  I discussed positioning during surgery of trendelenberg and risks of minor facial swelling and care we take in preoperative positioning. I counseled her that these risks are higher in diabetic patients due to increased risk for infection and wound healing problems.  We discussed alternative to surgery with is progesterone therapy. I discussed that this is  associated with a 30% reversal of pathology, but no definitive pathology established (including occult cancer diagnosis). After counseling and consideration of her options, she desires to proceed with robotic assisted total hysterectomy with bilateral sapingo-oophorectomy and SLN biopsy.   She will be seen by anesthesia for preoperative clearance and discussion of postoperative pain management.  We will check Hb A1c today to ensure it is trending down. She was given the opportunity to ask questions, which were answered to her satisfaction, and she is agreement with the above mentioned plan of care.  HPI: [name] is a 62 year old P5 who has a history of diabetes mellitus is seen in consultation at the request of Dr Deatra Ina for complex atypical hyperplasia.  The patient thinks she has been menopausal since her 60's but has continued to have intermittent vaginal bleeding and postcoital spotting particularly through 2019. She was seen by Dr Deatra Ina to discuss this in June, 2019. A TVUS was performed on 10/18/17 which showed a uterus measuring 8x4x3cm with a thickened endometrium.  A pap smear on the same day was normal and was HPV(high risk) negative. An endometrial pipelle biopsy on 10/18/17 showed complex hyperplasia with atypia, and squamous metaplasia at least, including a polyp. Well differentiated endometrioid adenocarcinoma cannot be excluded.   The patient reports 5 prior vaginal deliveries. Her only abdominal surgery was a tubal ligation. She has a family history on her maternal side significant for a maternal aunt with breast cancer, an uncle with pancreatic cancer, an uncle with lung cancer, a grandmother with lung cancer and her sister had pancreatic cancer.   She has diabetes mellitus. She had been on metformin only until June, 2019 when she was started on glimepiride and invokana when her  HbA1c was 9%. 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  Current Meds:   Outpatient Encounter Medications as of 10/25/2017  Medication Sig  . glimepiride (AMARYL) 4 MG tablet 1 tablet at bedtime.  . INVOKANA 100 MG TABS tablet 1 tablet daily.  Marland Kitchen lisinopril-hydrochlorothiazide (PRINZIDE,ZESTORETIC) 20-25 MG tablet 1 tablet daily.  Marland Kitchen oxyCODONE (OXY IR/ROXICODONE) 5 MG immediate release tablet Take 5 mg by mouth every 8 (eight) hours as needed. for pain  . traMADol (ULTRAM) 50 MG tablet tramadol 50 mg tablet  Take 1-2 tablet(s) EVERY 4-6 HOURS by oral route  . [DISCONTINUED] aspirin EC 81 MG tablet Take 81 mg daily by mouth.  . [DISCONTINUED] lisinopril-hydrochlorothiazide (PRINZIDE,ZESTORETIC) 10-12.5 MG per tablet Take 1 tablet by mouth daily.  . [DISCONTINUED] naproxen sodium (ALEVE) 220 MG tablet Take 220 mg 2 (two) times daily by mouth.  . [DISCONTINUED] pioglitazone-metformin (ACTOPLUS MET) 15-850 MG per tablet Take 1 tablet by mouth 2 (two) times daily with a meal.  . [DISCONTINUED] rosuvastatin (CRESTOR) 40 MG tablet Take 1 tablet (40 mg total) by mouth daily. (Patient not taking: Reported on 10/25/2017)  . [DISCONTINUED] traMADol (ULTRAM) 50 MG tablet Take 50 mg by mouth every 6 (six) hours as needed. For pain.   No facility-administered encounter medications on file as of 10/25/2017.     Allergy: No Known Allergies  Social Hx:   Social History   Socioeconomic History  . Marital status: Legally Separated    Spouse name: Not on file  . Number of children: Not on file  . Years of education: Not on file  . Highest education level: Not on file  Occupational History  . Not on file  Social Needs  . Financial resource strain: Not on file  . Food insecurity:    Worry: Not on file    Inability: Not on file  . Transportation needs:    Medical: Not on file    Non-medical: Not on file  Tobacco Use  . Smoking status: Never Smoker  . Smokeless tobacco: Never Used  Substance and Sexual Activity  . Alcohol use: No  . Drug use: No  . Sexual activity: Not  Currently  Lifestyle  . Physical activity:    Days per week: Not on file    Minutes per session: Not on file  . Stress: Not on file  Relationships  . Social connections:    Talks on phone: Not on file    Gets together: Not on file    Attends religious service: Not on file    Active member of club or organization: Not on file    Attends meetings of clubs or organizations: Not on file    Relationship status: Not on file  . Intimate partner violence:    Fear of current or ex partner: Not on file    Emotionally abused: Not on file    Physically abused: Not on file    Forced sexual activity: Not on file  Other Topics Concern  . Not on file  Social History Narrative   Social History      Diet?        Do you drink/eat things with caffeine? yes      Marital status?                                    What year were you married?  Do you live in a house, apartment, assisted living, condo, trailer, etc.? apartment      Is it one or more stories?      How many persons live in your home? me      Do you have any pets in your home? (please list) one dog      Highest level of education completed?      Current or past profession:      Do you exercise?                 no                     Type & how often?      Advanced Directives      Do you have a living will? no      Do you have a DNR form?           no                       If not, do you want to discuss one?      Do you have signed POA/HPOA for forms?  no      Functional Status      Do you have difficulty bathing or dressing yourself?      Do you have difficulty preparing food or eating?       Do you have difficulty managing your medications?      Do you have difficulty managing your finances?      Do you have difficulty affording your medications?    Past Surgical Hx:  Past Surgical History:  Procedure Laterality Date  . CARPAL TUNNEL RELEASE    . TUBAL LIGATION  1978    Past Medical Hx:  Past Medical  History:  Diagnosis Date  . Arthritis   . Arthritis    both legs  . Back pain   . Diabetes mellitus   . Dyslipidemia   . Hand pain   . Hyperlipidemia   . Hypertension   . Leg pain   . Vitamin D deficiency     Past Gynecological History:  See HPI, remote hx of abnormal pap smear in 90's treated with close observation.  No LMP recorded. Patient is postmenopausal.  Family Hx:  Family History  Problem Relation Age of Onset  . Hypertension Other   . Cancer Other   . Pancreatic cancer Sister   . Breast cancer Maternal Aunt   . Lung cancer Maternal Grandmother   . Pancreatic cancer Maternal Uncle   . Breast cancer Maternal Aunt   . Lung cancer Maternal Uncle   . Breast cancer Cousin   . Breast cancer Cousin     Review of Systems:  Constitutional  Feels well,    ENT Normal appearing ears and nares bilaterally Skin/Breast  No rash, sores, jaundice, itching, dryness Cardiovascular  No chest pain, shortness of breath, or edema  Pulmonary  No cough or wheeze.  Gastro Intestinal  No nausea, vomitting, or diarrhoea. No bright red blood per rectum, no abdominal pain, change in bowel movement, or constipation.  Genito Urinary  No frequency, urgency, dysuria, + postmenopausal bleeding Musculo Skeletal  No myalgia, arthralgia, joint swelling or pain  Neurologic  No weakness, numbness, change in gait,  Psychology  No depression, anxiety, insomnia.   Vitals:  Blood pressure (!) 137/95, pulse (!) 101, temperature 98.4 F (36.9 C), temperature source Oral, resp. rate 20,  height _0  (1.6 m), weight 162 lb 3.2 oz (73.6 kg), SpO2 100 %.  Physical Exam: WD in NAD Neck  Supple NROM, without any enlargements.  Lymph Node Survey No cervical supraclavicular or inguinal adenopathy Cardiovascular  Pulse normal rate, regularity and rhythm. S1 and S2 normal.  Lungs  Clear to auscultation bilateraly, without wheezes/crackles/rhonchi. Good air movement.  Skin  No  rash/lesions/breakdown  Psychiatry  Alert and oriented to person, place, and time  Abdomen  Normoactive bowel sounds, abdomen soft, non-tender and mildly obese without evidence of hernia.  Back No CVA tenderness Genito Urinary  Vulva/vagina: Normal external female genitalia.  No lesions. No discharge or bleeding.  Bladder/urethra:  No lesions or masses, well supported bladder  Vagina: normal  Cervix: Normal appearing, no lesions.  Uterus:  Small, mobile, no parametrial involvement or nodularity.  Adnexa: no masses. Rectal  deferred Extremities  No bilateral cyanosis, clubbing or edema.   Thereasa Solo, MD  10/25/2017, 1:39 PM

## 2017-10-25 NOTE — Progress Notes (Signed)
Consult Note: Gyn-Onc  Consult was requested by Dr. Deatra Ina for the evaluation of Deborah Camacho 62 y.o. female  CC:  Chief Complaint  Patient presents with  . Endometrial hyperplasia    Assessment/Plan:  [name]  is a 62 y.o.  year old with postmenopausal bleeding and complex atypical hyperplasia of the uterus. She also has type II diabetes mellitus.  A detailed discussion was held with the patient and her family with regard to to her atypical hyperplasia diagnosis and its association with cancer. We discussed that 40% of patients who had a preop diagnosis of endometrial hyperplasia will have cancer on final pathology. We discussed the standard management options for uterine cancer which includes surgery followed possibly by adjuvant therapy depending on the results of surgery. The options for surgical management include a hysterectomy and removal of the tubes and ovaries possibly with removal of pelvic and para-aortic lymph nodes.If feasible, a minimally invasive approach including a robotic hysterectomy or laparoscopic hysterectomy have benefits including shorter hospital stay, recovery time and better wound healing than with open surgery. The patient has been counseled about these surgical options and the risks of surgery in general including infection, bleeding, damage to surrounding structures (including bowel, bladder, ureters, nerves or vessels), and the postoperative risks of PE/ DVT, and lymphedema. I extensively reviewed the additional risks of robotic hysterectomy including possible need for conversion to open laparotomy.  I discussed positioning during surgery of trendelenberg and risks of minor facial swelling and care we take in preoperative positioning. I counseled her that these risks are higher in diabetic patients due to increased risk for infection and wound healing problems.  We discussed alternative to surgery with is progesterone therapy. I discussed that this is  associated with a 30% reversal of pathology, but no definitive pathology established (including occult cancer diagnosis). After counseling and consideration of her options, she desires to proceed with robotic assisted total hysterectomy with bilateral sapingo-oophorectomy and SLN biopsy.   She will be seen by anesthesia for preoperative clearance and discussion of postoperative pain management.  We will check Hb A1c today to ensure it is trending down. She was given the opportunity to ask questions, which were answered to her satisfaction, and she is agreement with the above mentioned plan of care.  HPI: [name] is a 62 year old P5 who has a history of diabetes mellitus is seen in consultation at the request of Dr Deatra Ina for complex atypical hyperplasia.  The patient thinks she has been menopausal since her 60's but has continued to have intermittent vaginal bleeding and postcoital spotting particularly through 2019. She was seen by Dr Deatra Ina to discuss this in June, 2019. A TVUS was performed on 10/18/17 which showed a uterus measuring 8x4x3cm with a thickened endometrium.  A pap smear on the same day was normal and was HPV(high risk) negative. An endometrial pipelle biopsy on 10/18/17 showed complex hyperplasia with atypia, and squamous metaplasia at least, including a polyp. Well differentiated endometrioid adenocarcinoma cannot be excluded.   The patient reports 5 prior vaginal deliveries. Her only abdominal surgery was a tubal ligation. She has a family history on her maternal side significant for a maternal aunt with breast cancer, an uncle with pancreatic cancer, an uncle with lung cancer, a grandmother with lung cancer and her sister had pancreatic cancer.   She has diabetes mellitus. She had been on metformin only until June, 2019 when she was started on glimepiride and invokana when her  HbA1c was 9%. 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  Current Meds:   Outpatient Encounter Medications as of 10/25/2017  Medication Sig  . glimepiride (AMARYL) 4 MG tablet 1 tablet at bedtime.  . INVOKANA 100 MG TABS tablet 1 tablet daily.  Marland Kitchen lisinopril-hydrochlorothiazide (PRINZIDE,ZESTORETIC) 20-25 MG tablet 1 tablet daily.  Marland Kitchen oxyCODONE (OXY IR/ROXICODONE) 5 MG immediate release tablet Take 5 mg by mouth every 8 (eight) hours as needed. for pain  . traMADol (ULTRAM) 50 MG tablet tramadol 50 mg tablet  Take 1-2 tablet(s) EVERY 4-6 HOURS by oral route  . [DISCONTINUED] aspirin EC 81 MG tablet Take 81 mg daily by mouth.  . [DISCONTINUED] lisinopril-hydrochlorothiazide (PRINZIDE,ZESTORETIC) 10-12.5 MG per tablet Take 1 tablet by mouth daily.  . [DISCONTINUED] naproxen sodium (ALEVE) 220 MG tablet Take 220 mg 2 (two) times daily by mouth.  . [DISCONTINUED] pioglitazone-metformin (ACTOPLUS MET) 15-850 MG per tablet Take 1 tablet by mouth 2 (two) times daily with a meal.  . [DISCONTINUED] rosuvastatin (CRESTOR) 40 MG tablet Take 1 tablet (40 mg total) by mouth daily. (Patient not taking: Reported on 10/25/2017)  . [DISCONTINUED] traMADol (ULTRAM) 50 MG tablet Take 50 mg by mouth every 6 (six) hours as needed. For pain.   No facility-administered encounter medications on file as of 10/25/2017.     Allergy: No Known Allergies  Social Hx:   Social History   Socioeconomic History  . Marital status: Legally Separated    Spouse name: Not on file  . Number of children: Not on file  . Years of education: Not on file  . Highest education level: Not on file  Occupational History  . Not on file  Social Needs  . Financial resource strain: Not on file  . Food insecurity:    Worry: Not on file    Inability: Not on file  . Transportation needs:    Medical: Not on file    Non-medical: Not on file  Tobacco Use  . Smoking status: Never Smoker  . Smokeless tobacco: Never Used  Substance and Sexual Activity  . Alcohol use: No  . Drug use: No  . Sexual activity: Not  Currently  Lifestyle  . Physical activity:    Days per week: Not on file    Minutes per session: Not on file  . Stress: Not on file  Relationships  . Social connections:    Talks on phone: Not on file    Gets together: Not on file    Attends religious service: Not on file    Active member of club or organization: Not on file    Attends meetings of clubs or organizations: Not on file    Relationship status: Not on file  . Intimate partner violence:    Fear of current or ex partner: Not on file    Emotionally abused: Not on file    Physically abused: Not on file    Forced sexual activity: Not on file  Other Topics Concern  . Not on file  Social History Narrative   Social History      Diet?        Do you drink/eat things with caffeine? yes      Marital status?                                    What year were you married?  Do you live in a house, apartment, assisted living, condo, trailer, etc.? apartment      Is it one or more stories?      How many persons live in your home? me      Do you have any pets in your home? (please list) one dog      Highest level of education completed?      Current or past profession:      Do you exercise?                 no                     Type & how often?      Advanced Directives      Do you have a living will? no      Do you have a DNR form?           no                       If not, do you want to discuss one?      Do you have signed POA/HPOA for forms?  no      Functional Status      Do you have difficulty bathing or dressing yourself?      Do you have difficulty preparing food or eating?       Do you have difficulty managing your medications?      Do you have difficulty managing your finances?      Do you have difficulty affording your medications?    Past Surgical Hx:  Past Surgical History:  Procedure Laterality Date  . CARPAL TUNNEL RELEASE    . TUBAL LIGATION  1978    Past Medical Hx:  Past Medical  History:  Diagnosis Date  . Arthritis   . Arthritis    both legs  . Back pain   . Diabetes mellitus   . Dyslipidemia   . Hand pain   . Hyperlipidemia   . Hypertension   . Leg pain   . Vitamin D deficiency     Past Gynecological History:  See HPI, remote hx of abnormal pap smear in 90's treated with close observation.  No LMP recorded. Patient is postmenopausal.  Family Hx:  Family History  Problem Relation Age of Onset  . Hypertension Other   . Cancer Other   . Pancreatic cancer Sister   . Breast cancer Maternal Aunt   . Lung cancer Maternal Grandmother   . Pancreatic cancer Maternal Uncle   . Breast cancer Maternal Aunt   . Lung cancer Maternal Uncle   . Breast cancer Cousin   . Breast cancer Cousin     Review of Systems:  Constitutional  Feels well,    ENT Normal appearing ears and nares bilaterally Skin/Breast  No rash, sores, jaundice, itching, dryness Cardiovascular  No chest pain, shortness of breath, or edema  Pulmonary  No cough or wheeze.  Gastro Intestinal  No nausea, vomitting, or diarrhoea. No bright red blood per rectum, no abdominal pain, change in bowel movement, or constipation.  Genito Urinary  No frequency, urgency, dysuria, + postmenopausal bleeding Musculo Skeletal  No myalgia, arthralgia, joint swelling or pain  Neurologic  No weakness, numbness, change in gait,  Psychology  No depression, anxiety, insomnia.   Vitals:  Blood pressure (!) 137/95, pulse (!) 101, temperature 98.4 F (36.9 C), temperature source Oral, resp. rate 20,  height _0  (1.6 m), weight 162 lb 3.2 oz (73.6 kg), SpO2 100 %.  Physical Exam: WD in NAD Neck  Supple NROM, without any enlargements.  Lymph Node Survey No cervical supraclavicular or inguinal adenopathy Cardiovascular  Pulse normal rate, regularity and rhythm. S1 and S2 normal.  Lungs  Clear to auscultation bilateraly, without wheezes/crackles/rhonchi. Good air movement.  Skin  No  rash/lesions/breakdown  Psychiatry  Alert and oriented to person, place, and time  Abdomen  Normoactive bowel sounds, abdomen soft, non-tender and mildly obese without evidence of hernia.  Back No CVA tenderness Genito Urinary  Vulva/vagina: Normal external female genitalia.  No lesions. No discharge or bleeding.  Bladder/urethra:  No lesions or masses, well supported bladder  Vagina: normal  Cervix: Normal appearing, no lesions.  Uterus:  Small, mobile, no parametrial involvement or nodularity.  Adnexa: no masses. Rectal  deferred Extremities  No bilateral cyanosis, clubbing or edema.   Thereasa Solo, MD  10/25/2017, 1:39 PM

## 2017-11-01 NOTE — Patient Instructions (Signed)
SAMINA WEEKES  11/01/2017   Your procedure is scheduled on: 11/04/2017   Report to [facility] Main  Entrance  Report to admitting at     0530AM    Call this number if you have problems the morning of surgery [phone]   Remember: Do not eat food or drink liquids :After Midnight.Eat a light diet the day before surgery.  Examples include: soups, broths, toast, yogurt and mashed potatoes.  Things to avoid include carbonated beverages, raw fruits and vegetables and beans.      Take these medicines the morning of surgery with A SIP OF WATER: Oxycodone if needed  DO NOT TAKE ANY DIABETIC MEDICATIONS DAY OF YOUR SURGERY                               You may not have any metal on your body including hair pins and              piercings  Do not wear jewelry, make-up, lotions, powders or perfumes, deodorant             Do not wear nail polish.  Do not shave  48 hours prior to surgery.                Do not bring valuables to the hospital. [place].  Contacts, dentures or bridgework may not be worn into surgery.  Leave suitcase in the car. After surgery it may be brought to your room.         Special Instructions: coughing and deep breathing exercises, leg exercises               Please read over the following fact sheets you were given: _____________________________________________________________________             [facility] - Preparing for Surgery Before surgery, you can play an important role.  Because skin is not sterile, your skin needs to be as free of germs as possible.  You can reduce the number of germs on your skin by washing with CHG (chlorahexidine gluconate) soap before surgery.  CHG is an antiseptic cleaner which kills germs and bonds with the skin to continue killing germs even after washing. Please DO NOT use if you have an allergy to CHG or antibacterial soaps.  If your skin becomes  reddened/irritated stop using the CHG and inform your nurse when you arrive at Short Stay. Do not shave (including legs and underarms) for at least 48 hours prior to the first CHG shower.  You may shave your face/neck. Please follow these instructions carefully:  1.  Shower with CHG Soap the night before surgery and the  morning of Surgery.  2.  If you choose to wash your hair, wash your hair first as usual with your  normal  shampoo.  3.  After you shampoo, rinse your hair and body thoroughly to remove the  shampoo.                           4.  Use CHG as you would any other liquid soap.  You can apply chg directly  to the skin and wash  Gently with a scrungie or clean washcloth.  5.  Apply the CHG Soap to your body ONLY FROM THE NECK DOWN.   Do not use on face/ open                           Wound or open sores. Avoid contact with eyes, ears mouth and genitals (private parts).                       Wash face,  Genitals (private parts) with your normal soap.             6.  Wash thoroughly, paying special attention to the area where your surgery  will be performed.  7.  Thoroughly rinse your body with warm water from the neck down.  8.  DO NOT shower/wash with your normal soap after using and rinsing off  the CHG Soap.                9.  Pat yourself dry with a clean towel.            10.  Wear clean pajamas.            11.  Place clean sheets on your bed the night of your first shower and do not  sleep with pets. Day of Surgery : Do not apply any lotions/deodorants the morning of surgery.  Please wear clean clothes to the hospital/surgery center.  FAILURE TO FOLLOW THESE INSTRUCTIONS MAY RESULT IN THE CANCELLATION OF YOUR SURGERY PATIENT SIGNATURE_________________________________  NURSE SIGNATURE__________________________________  ________________________________________________________________________  WHAT IS A BLOOD TRANSFUSION? Blood Transfusion Information  A  transfusion is the replacement of blood or some of its parts. Blood is made up of multiple cells which provide different functions.  Red blood cells carry oxygen and are used for blood loss replacement.  White blood cells fight against infection.  Platelets control bleeding.  Plasma helps clot blood.  Other blood products are available for specialized needs, such as hemophilia or other clotting disorders. BEFORE THE TRANSFUSION  Who gives blood for transfusions?   Healthy volunteers who are fully evaluated to make sure their blood is safe. This is blood bank blood. Transfusion therapy is the safest it has ever been in the practice of medicine. Before blood is taken from a donor, a complete history is taken to make sure that person has no history of diseases nor engages in risky social behavior (examples are intravenous drug use or sexual activity with multiple partners). The donor's travel history is screened to minimize risk of transmitting infections, such as malaria. The donated blood is tested for signs of infectious diseases, such as HIV and hepatitis. The blood is then tested to be sure it is compatible with you in order to minimize the chance of a transfusion reaction. If you or a relative donates blood, this is often done in anticipation of surgery and is not appropriate for emergency situations. It takes many days to process the donated blood. RISKS AND COMPLICATIONS Although transfusion therapy is very safe and saves many lives, the main dangers of transfusion include:   Getting an infectious disease.  Developing a transfusion reaction. This is an allergic reaction to something in the blood you were given. Every precaution is taken to prevent this. The decision to have a blood transfusion has been considered carefully by your caregiver before blood is given. Blood is not given unless the benefits outweigh  the risks. AFTER THE TRANSFUSION  Right after receiving a blood transfusion,  you will usually feel much better and more energetic. This is especially true if your red blood cells have gotten low (anemic). The transfusion raises the level of the red blood cells which carry oxygen, and this usually causes an energy increase.  The nurse administering the transfusion will monitor you carefully for complications. HOME CARE INSTRUCTIONS  No special instructions are needed after a transfusion. You may find your energy is better. Speak with your caregiver about any limitations on activity for underlying diseases you may have. SEEK MEDICAL CARE IF:   Your condition is not improving after your transfusion.  You develop redness or irritation at the intravenous (IV) site. SEEK IMMEDIATE MEDICAL CARE IF:  Any of the following symptoms occur over the next 12 hours:  Shaking chills.  You have a temperature by mouth above 102 F (38.9 C), not controlled by medicine.  Chest, back, or muscle pain.  People around you feel you are not acting correctly or are confused.  Shortness of breath or difficulty breathing.  Dizziness and fainting.  You get a rash or develop hives.  You have a decrease in urine output.  Your urine turns a dark color or changes to pink, red, or brown. Any of the following symptoms occur over the next 10 days:  You have a temperature by mouth above 102 F (38.9 C), not controlled by medicine.  Shortness of breath.  Weakness after normal activity.  The white part of the eye turns yellow (jaundice).  You have a decrease in the amount of urine or are urinating less often.  Your urine turns a dark color or changes to pink, red, or brown. Document Released: 04/03/2000 Document Revised: 06/29/2011 Document Reviewed: 11/21/2007 Exit[employer] Patient Information 2014 [place].  _______________________________________________________________________  Incentive Spirometer  An incentive spirometer is a tool that can help keep your lungs clear and  active. This tool measures how well you are filling your lungs with each breath. Taking long deep breaths may help reverse or decrease the chance of developing breathing (pulmonary) problems (especially infection) following:  A long period of time when you are unable to move or be active. BEFORE THE PROCEDURE   If the spirometer includes an indicator to show your best effort, your nurse or respiratory therapist will set it to a desired goal.  If possible, sit up straight or lean slightly forward. Try not to slouch.  Hold the incentive spirometer in an upright position. INSTRUCTIONS FOR USE  1. Sit on the edge of your bed if possible, or sit up as far as you can in bed or on a chair. 2. Hold the incentive spirometer in an upright position. 3. Breathe out normally. 4. Place the mouthpiece in your mouth and seal your lips tightly around it. 5. Breathe in slowly and as deeply as possible, raising the piston or the ball toward the top of the column. 6. Hold your breath for 3-5 seconds or for as long as possible. Allow the piston or ball to fall to the bottom of the column. 7. Remove the mouthpiece from your mouth and breathe out normally. 8. Rest for a few seconds and repeat Steps 1 through 7 at least 10 times every 1-2 hours when you are awake. Take your time and take a few normal breaths between deep breaths. 9. The spirometer may include an indicator to show your best effort. Use the indicator as a goal to work  toward during each repetition. 10. After each set of 10 deep breaths, practice coughing to be sure your lungs are clear. If you have an incision (the cut made at the time of surgery), support your incision when coughing by placing a pillow or rolled up towels firmly against it. Once you are able to get out of bed, walk around indoors and cough well. You may stop using the incentive spirometer when instructed by your caregiver.  RISKS AND COMPLICATIONS  Take your time so you do not get  dizzy or light-headed.  If you are in pain, you may need to take or ask for pain medication before doing incentive spirometry. It is harder to take a deep breath if you are having pain. AFTER USE  Rest and breathe slowly and easily.  It can be helpful to keep track of a log of your progress. Your caregiver can provide you with a simple table to help with this. If you are using the spirometer at home, follow these instructions: [place] IF:   You are having difficultly using the spirometer.  You have trouble using the spirometer as often as instructed.  Your pain medication is not giving enough relief while using the spirometer.  You develop fever of 100.5 F (38.1 C) or higher. SEEK IMMEDIATE MEDICAL CARE IF:   You cough up bloody sputum that had not been present before.  You develop fever of 102 F (38.9 C) or greater.  You develop worsening pain at or near the incision site. MAKE SURE YOU:   Understand these instructions.  Will watch your condition.  Will get help right away if you are not doing well or get worse. Document Released: 08/17/2006 Document Revised: 06/29/2011 Document Reviewed: 10/18/2006 [facility] Patient Information 2014 [place], [place].   ________________________________________________________________________

## 2017-11-01 NOTE — Progress Notes (Signed)
HGBA1C-10/25/17-epic -9.3

## 2017-11-02 ENCOUNTER — Encounter (HOSPITAL_COMMUNITY)
Admission: RE | Admit: 2017-11-02 | Discharge: 2017-11-02 | Disposition: A | Discharge status: Home / Self Care | Payer: Medicare Other | Source: Ambulatory Visit | Attending: Gynecologic Oncology | Admitting: Gynecologic Oncology

## 2017-11-02 ENCOUNTER — Other Ambulatory Visit: Payer: Self-pay

## 2017-11-02 ENCOUNTER — Encounter (HOSPITAL_COMMUNITY): Payer: Self-pay

## 2017-11-02 DIAGNOSIS — N8501 Benign endometrial hyperplasia: Secondary | ICD-10-CM | POA: Insufficient documentation

## 2017-11-02 DIAGNOSIS — E119 Type 2 diabetes mellitus without complications: Secondary | ICD-10-CM | POA: Insufficient documentation

## 2017-11-02 DIAGNOSIS — Z79899 Other long term (current) drug therapy: Secondary | ICD-10-CM | POA: Diagnosis not present

## 2017-11-02 DIAGNOSIS — Z7984 Long term (current) use of oral hypoglycemic drugs: Secondary | ICD-10-CM | POA: Diagnosis not present

## 2017-11-02 DIAGNOSIS — Z01812 Encounter for preprocedural laboratory examination: Secondary | ICD-10-CM | POA: Insufficient documentation

## 2017-11-02 DIAGNOSIS — I10 Essential (primary) hypertension: Secondary | ICD-10-CM | POA: Diagnosis not present

## 2017-11-02 DIAGNOSIS — E785 Hyperlipidemia, unspecified: Secondary | ICD-10-CM | POA: Diagnosis not present

## 2017-11-02 DIAGNOSIS — N950 Postmenopausal bleeding: Secondary | ICD-10-CM | POA: Diagnosis not present

## 2017-11-02 DIAGNOSIS — N8502 Endometrial intraepithelial neoplasia [EIN]: Secondary | ICD-10-CM | POA: Diagnosis present

## 2017-11-02 DIAGNOSIS — E559 Vitamin D deficiency, unspecified: Secondary | ICD-10-CM | POA: Diagnosis not present

## 2017-11-02 LAB — URINALYSIS, ROUTINE W REFLEX MICROSCOPIC
Bilirubin Urine: NEGATIVE
Glucose, UA: >=500 mg/dL — AB
Hgb urine dipstick: NEGATIVE
Ketones, ur: NEGATIVE mg/dL
Leukocytes, UA: NEGATIVE
Nitrite: NEGATIVE
Protein, ur: NEGATIVE mg/dL
Specific Gravity, Urine: 1.013 (ref 1.005–1.030)
pH: 5 (ref 5.0–8.0)

## 2017-11-02 LAB — COMPREHENSIVE METABOLIC PANEL
ALBUMIN: 4 g/dL (ref 3.5–5.0)
ALT: 19 U/L (ref 0–44)
AST: 30 U/L (ref 15–41)
Alkaline Phosphatase: 67 U/L (ref 38–126)
Anion gap: 10 (ref 5–15)
BILIRUBIN TOTAL: 0.4 mg/dL (ref 0.3–1.2)
BUN: 17 mg/dL (ref 8–23)
CHLORIDE: 99 mmol/L (ref 98–111)
CO2: 30 mmol/L (ref 22–32)
Calcium: 9.5 mg/dL (ref 8.9–10.3)
Creatinine, Ser: 1.28 mg/dL — ABNORMAL HIGH (ref 0.44–1.00)
GFR calc Af Amer: 50 mL/min — ABNORMAL LOW (ref >60)
GFR calc non Af Amer: 44 mL/min — ABNORMAL LOW (ref >60)
Glucose, Bld: 114 mg/dL — ABNORMAL HIGH (ref 70–99)
POTASSIUM: 4.3 mmol/L (ref 3.5–5.1)
SODIUM: 139 mmol/L (ref 135–145)
Total Protein: 7.7 g/dL (ref 6.5–8.1)

## 2017-11-02 LAB — CBC
HEMATOCRIT: 44.4 % (ref 36.0–46.0)
Hemoglobin: 13.9 g/dL (ref 12.0–15.0)
MCH: 28 pg (ref 26.0–34.0)
MCHC: 31.3 g/dL (ref 30.0–36.0)
MCV: 89.5 fL (ref 78.0–100.0)
Platelets: 221 10*3/uL (ref 150–400)
RBC: 4.96 MIL/uL (ref 3.87–5.11)
RDW: 14.4 % (ref 11.5–15.5)
WBC: 6.9 10*3/uL (ref 4.0–10.5)

## 2017-11-02 LAB — GLUCOSE, CAPILLARY: Glucose-Capillary: 91 mg/dL (ref 70–99)

## 2017-11-02 LAB — ABO/RH: ABO/RH(D): O POS

## 2017-11-04 ENCOUNTER — Ambulatory Visit (HOSPITAL_COMMUNITY)
Admission: RE | Admit: 2017-11-04 | Discharge: 2017-11-05 | Disposition: A | Discharge status: Home / Self Care | Payer: Medicare Other | Source: Ambulatory Visit | Attending: Gynecologic Oncology | Admitting: Gynecologic Oncology

## 2017-11-04 ENCOUNTER — Ambulatory Visit (HOSPITAL_COMMUNITY): Payer: Medicare Other | Admitting: Certified Registered Nurse Anesthetist

## 2017-11-04 ENCOUNTER — Other Ambulatory Visit: Payer: Self-pay

## 2017-11-04 ENCOUNTER — Encounter (HOSPITAL_COMMUNITY): Payer: Self-pay | Admitting: *Deleted

## 2017-11-04 ENCOUNTER — Encounter (HOSPITAL_COMMUNITY)
Admission: RE | Disposition: A | Discharge status: Home / Self Care | Payer: Self-pay | Source: Ambulatory Visit | Attending: Gynecologic Oncology

## 2017-11-04 DIAGNOSIS — N950 Postmenopausal bleeding: Secondary | ICD-10-CM | POA: Insufficient documentation

## 2017-11-04 DIAGNOSIS — E119 Type 2 diabetes mellitus without complications: Secondary | ICD-10-CM | POA: Diagnosis not present

## 2017-11-04 DIAGNOSIS — I10 Essential (primary) hypertension: Secondary | ICD-10-CM | POA: Insufficient documentation

## 2017-11-04 DIAGNOSIS — E669 Obesity, unspecified: Secondary | ICD-10-CM | POA: Diagnosis present

## 2017-11-04 DIAGNOSIS — Z79899 Other long term (current) drug therapy: Secondary | ICD-10-CM | POA: Insufficient documentation

## 2017-11-04 DIAGNOSIS — N8502 Endometrial intraepithelial neoplasia [EIN]: Secondary | ICD-10-CM | POA: Diagnosis not present

## 2017-11-04 DIAGNOSIS — N8500 Endometrial hyperplasia, unspecified: Secondary | ICD-10-CM | POA: Diagnosis present

## 2017-11-04 DIAGNOSIS — E559 Vitamin D deficiency, unspecified: Secondary | ICD-10-CM | POA: Insufficient documentation

## 2017-11-04 DIAGNOSIS — E785 Hyperlipidemia, unspecified: Secondary | ICD-10-CM | POA: Insufficient documentation

## 2017-11-04 DIAGNOSIS — Z7984 Long term (current) use of oral hypoglycemic drugs: Secondary | ICD-10-CM | POA: Insufficient documentation

## 2017-11-04 HISTORY — PX: SENTINEL NODE BIOPSY: SHX6608

## 2017-11-04 HISTORY — PX: ROBOTIC ASSISTED TOTAL HYSTERECTOMY WITH BILATERAL SALPINGO OOPHERECTOMY: SHX6086

## 2017-11-04 LAB — TYPE AND SCREEN
ABO/RH(D): O POS
ANTIBODY SCREEN: NEGATIVE

## 2017-11-04 LAB — GLUCOSE, CAPILLARY
GLUCOSE-CAPILLARY: 201 mg/dL — AB (ref 70–99)
GLUCOSE-CAPILLARY: 220 mg/dL — AB (ref 70–99)
Glucose-Capillary: 138 mg/dL — ABNORMAL HIGH (ref 70–99)
Glucose-Capillary: 167 mg/dL — ABNORMAL HIGH (ref 70–99)

## 2017-11-04 SURGERY — HYSTERECTOMY, TOTAL, ROBOT-ASSISTED, LAPAROSCOPIC, WITH BILATERAL SALPINGO-OOPHORECTOMY
Anesthesia: General

## 2017-11-04 MED ORDER — PHENYLEPHRINE 40 MCG/ML (10ML) SYRINGE FOR IV PUSH (FOR BLOOD PRESSURE SUPPORT)
PREFILLED_SYRINGE | INTRAVENOUS | Status: AC
  Filled 2017-11-04: qty 20

## 2017-11-04 MED ORDER — KETAMINE HCL 10 MG/ML IJ SOLN
INTRAMUSCULAR | Status: DC | PRN
  Administered 2017-11-04: 30 mg via INTRAVENOUS

## 2017-11-04 MED ORDER — DEXAMETHASONE SODIUM PHOSPHATE 4 MG/ML IJ SOLN
4.0000 mg | INTRAMUSCULAR | Status: AC
  Administered 2017-11-04: 4 mg via INTRAVENOUS

## 2017-11-04 MED ORDER — PROPOFOL 10 MG/ML IV BOLUS
INTRAVENOUS | Status: AC
  Filled 2017-11-04: qty 20

## 2017-11-04 MED ORDER — CEFAZOLIN SODIUM-DEXTROSE 2-4 GM/100ML-% IV SOLN
2.0000 g | INTRAVENOUS | Status: AC
  Administered 2017-11-04: 2 g via INTRAVENOUS
  Filled 2017-11-04: qty 100

## 2017-11-04 MED ORDER — LIDOCAINE 2% (20 MG/ML) 5 ML SYRINGE
INTRAMUSCULAR | Status: DC | PRN
  Administered 2017-11-04: 70 mg via INTRAVENOUS

## 2017-11-04 MED ORDER — OXYCODONE HCL 5 MG PO TABS: 5.0000 mg | ORAL_TABLET | ORAL | Status: DC | PRN

## 2017-11-04 MED ORDER — CELECOXIB 200 MG PO CAPS
400.0000 mg | ORAL_CAPSULE | ORAL | Status: AC
  Administered 2017-11-04: 400 mg via ORAL
  Filled 2017-11-04: qty 2

## 2017-11-04 MED ORDER — FENTANYL CITRATE (PF) 100 MCG/2ML IJ SOLN
INTRAMUSCULAR | Status: DC | PRN
  Administered 2017-11-04: 100 ug via INTRAVENOUS
  Administered 2017-11-04: 50 ug via INTRAVENOUS

## 2017-11-04 MED ORDER — ENOXAPARIN SODIUM 40 MG/0.4ML ~~LOC~~ SOLN
40.0000 mg | SUBCUTANEOUS | Status: AC
  Administered 2017-11-04: 40 mg via SUBCUTANEOUS
  Filled 2017-11-04: qty 0.4

## 2017-11-04 MED ORDER — KETAMINE HCL 10 MG/ML IJ SOLN
INTRAMUSCULAR | Status: AC
  Filled 2017-11-04: qty 1

## 2017-11-04 MED ORDER — IBUPROFEN 200 MG PO TABS: 600.0000 mg | ORAL_TABLET | Freq: Four times a day (QID) | ORAL | Status: DC

## 2017-11-04 MED ORDER — HYDROMORPHONE HCL 1 MG/ML IJ SOLN
INTRAMUSCULAR | Status: AC
Start: 2017-11-04 — End: 2017-11-04
  Filled 2017-11-04: qty 1

## 2017-11-04 MED ORDER — TRAMADOL HCL 50 MG PO TABS: 100.0000 mg | ORAL_TABLET | Freq: Two times a day (BID) | ORAL | Status: DC | PRN

## 2017-11-04 MED ORDER — GABAPENTIN 300 MG PO CAPS
300.0000 mg | ORAL_CAPSULE | ORAL | Status: AC
  Administered 2017-11-04: 300 mg via ORAL
  Filled 2017-11-04: qty 1

## 2017-11-04 MED ORDER — ONDANSETRON HCL 4 MG PO TABS: 4.0000 mg | ORAL_TABLET | Freq: Four times a day (QID) | ORAL | Status: DC | PRN

## 2017-11-04 MED ORDER — ONDANSETRON HCL 4 MG/2ML IJ SOLN
INTRAMUSCULAR | Status: DC | PRN
  Administered 2017-11-04: 4 mg via INTRAVENOUS

## 2017-11-04 MED ORDER — CELECOXIB 200 MG PO CAPS
ORAL_CAPSULE | ORAL | Status: AC
  Filled 2017-11-04: qty 1

## 2017-11-04 MED ORDER — PHENYLEPHRINE HCL 10 MG/ML IJ SOLN
INTRAMUSCULAR | Status: AC
  Filled 2017-11-04: qty 1

## 2017-11-04 MED ORDER — MEPERIDINE HCL 50 MG/ML IJ SOLN: 6.2500 mg | INTRAMUSCULAR | Status: DC | PRN

## 2017-11-04 MED ORDER — FENTANYL CITRATE (PF) 250 MCG/5ML IJ SOLN
INTRAMUSCULAR | Status: AC
  Filled 2017-11-04: qty 5

## 2017-11-04 MED ORDER — INSULIN ASPART 100 UNIT/ML ~~LOC~~ SOLN
3.0000 [IU] | Freq: Three times a day (TID) | SUBCUTANEOUS | Status: DC
  Administered 2017-11-04: 3 [IU] via SUBCUTANEOUS

## 2017-11-04 MED ORDER — LISINOPRIL-HYDROCHLOROTHIAZIDE 20-25 MG PO TABS: 1.0000 | ORAL_TABLET | Freq: Every day | ORAL | Status: DC

## 2017-11-04 MED ORDER — EPHEDRINE SULFATE-NACL 50-0.9 MG/10ML-% IV SOSY
PREFILLED_SYRINGE | INTRAVENOUS | Status: DC | PRN
  Administered 2017-11-04: 10 mg via INTRAVENOUS

## 2017-11-04 MED ORDER — FENTANYL CITRATE (PF) 100 MCG/2ML IJ SOLN
INTRAMUSCULAR | Status: AC
Start: 2017-11-04 — End: 2017-11-04
  Filled 2017-11-04: qty 2

## 2017-11-04 MED ORDER — STERILE WATER FOR INJECTION IJ SOLN
INTRAMUSCULAR | Status: AC
  Filled 2017-11-04: qty 10

## 2017-11-04 MED ORDER — DEXAMETHASONE SODIUM PHOSPHATE 10 MG/ML IJ SOLN
INTRAMUSCULAR | Status: AC
  Filled 2017-11-04: qty 1

## 2017-11-04 MED ORDER — STERILE WATER FOR IRRIGATION IR SOLN
Status: DC | PRN
  Administered 2017-11-04: 1000 mL

## 2017-11-04 MED ORDER — HYDROMORPHONE HCL 1 MG/ML IJ SOLN
0.2000 mg | INTRAMUSCULAR | Status: DC | PRN
  Administered 2017-11-04 (×2): 0.5 mg via INTRAVENOUS
  Filled 2017-11-04 (×2): qty 1

## 2017-11-04 MED ORDER — SODIUM CHLORIDE 0.9 % IV SOLN
INTRAVENOUS | Status: DC | PRN
  Administered 2017-11-04: 80 ug/min via INTRAVENOUS

## 2017-11-04 MED ORDER — LIDOCAINE HCL 2 % IJ SOLN
INTRAMUSCULAR | Status: AC
  Filled 2017-11-04: qty 20

## 2017-11-04 MED ORDER — KCL IN DEXTROSE-NACL 20-5-0.45 MEQ/L-%-% IV SOLN
INTRAVENOUS | Status: DC
  Administered 2017-11-04: 12:00:00 via INTRAVENOUS
  Filled 2017-11-04 (×2): qty 1000

## 2017-11-04 MED ORDER — ROCURONIUM BROMIDE 50 MG/5ML IV SOSY
PREFILLED_SYRINGE | INTRAVENOUS | Status: DC | PRN
  Administered 2017-11-04: 50 mg via INTRAVENOUS

## 2017-11-04 MED ORDER — PHENYLEPHRINE 40 MCG/ML (10ML) SYRINGE FOR IV PUSH (FOR BLOOD PRESSURE SUPPORT)
PREFILLED_SYRINGE | INTRAVENOUS | Status: DC | PRN
  Administered 2017-11-04: 80 ug via INTRAVENOUS
  Administered 2017-11-04 (×2): 120 ug via INTRAVENOUS
  Administered 2017-11-04: 80 ug via INTRAVENOUS
  Administered 2017-11-04 (×2): 120 ug via INTRAVENOUS
  Administered 2017-11-04: 80 ug via INTRAVENOUS

## 2017-11-04 MED ORDER — SUGAMMADEX SODIUM 200 MG/2ML IV SOLN
INTRAVENOUS | Status: AC
  Filled 2017-11-04: qty 2

## 2017-11-04 MED ORDER — INSULIN ASPART 100 UNIT/ML ~~LOC~~ SOLN
0.0000 [IU] | Freq: Three times a day (TID) | SUBCUTANEOUS | Status: DC
  Administered 2017-11-04: 5 [IU] via SUBCUTANEOUS

## 2017-11-04 MED ORDER — LACTATED RINGERS IV SOLN
INTRAVENOUS | Status: DC
  Administered 2017-11-04 (×2): via INTRAVENOUS

## 2017-11-04 MED ORDER — LIDOCAINE 2% (20 MG/ML) 5 ML SYRINGE
INTRAMUSCULAR | Status: AC
  Filled 2017-11-04: qty 5

## 2017-11-04 MED ORDER — ONDANSETRON HCL 4 MG/2ML IJ SOLN: 4.0000 mg | Freq: Four times a day (QID) | INTRAMUSCULAR | Status: DC | PRN

## 2017-11-04 MED ORDER — LIDOCAINE 2% (20 MG/ML) 5 ML SYRINGE
INTRAMUSCULAR | Status: DC | PRN
  Administered 2017-11-04: 1.5 mg/kg/h via INTRAVENOUS

## 2017-11-04 MED ORDER — GABAPENTIN 300 MG PO CAPS
600.0000 mg | ORAL_CAPSULE | Freq: Every day | ORAL | Status: AC
  Administered 2017-11-04: 600 mg via ORAL
  Filled 2017-11-04: qty 2

## 2017-11-04 MED ORDER — ENOXAPARIN SODIUM 40 MG/0.4ML ~~LOC~~ SOLN
40.0000 mg | SUBCUTANEOUS | Status: DC
  Administered 2017-11-05: 40 mg via SUBCUTANEOUS
  Filled 2017-11-04: qty 0.4

## 2017-11-04 MED ORDER — ACETAMINOPHEN 500 MG PO TABS
1000.0000 mg | ORAL_TABLET | ORAL | Status: AC
  Administered 2017-11-04: 1000 mg via ORAL
  Filled 2017-11-04: qty 2

## 2017-11-04 MED ORDER — PROPOFOL 10 MG/ML IV BOLUS
INTRAVENOUS | Status: DC | PRN
  Administered 2017-11-04: 130 mg via INTRAVENOUS

## 2017-11-04 MED ORDER — FENTANYL CITRATE (PF) 100 MCG/2ML IJ SOLN
INTRAMUSCULAR | Status: AC
  Filled 2017-11-04: qty 2

## 2017-11-04 MED ORDER — HYDROCHLOROTHIAZIDE 25 MG PO TABS
25.0000 mg | ORAL_TABLET | Freq: Every day | ORAL | Status: DC
  Administered 2017-11-05: 25 mg via ORAL
  Filled 2017-11-04: qty 1

## 2017-11-04 MED ORDER — ACETAMINOPHEN 500 MG PO TABS
1000.0000 mg | ORAL_TABLET | Freq: Four times a day (QID) | ORAL | Status: DC
  Administered 2017-11-04 – 2017-11-05 (×3): 1000 mg via ORAL
  Filled 2017-11-04 (×3): qty 2

## 2017-11-04 MED ORDER — MIDAZOLAM HCL 5 MG/5ML IJ SOLN
INTRAMUSCULAR | Status: DC | PRN
  Administered 2017-11-04: 2 mg via INTRAVENOUS

## 2017-11-04 MED ORDER — SCOPOLAMINE 1 MG/3DAYS TD PT72
1.0000 | MEDICATED_PATCH | TRANSDERMAL | Status: DC
  Administered 2017-11-04: 1.5 mg via TRANSDERMAL
  Filled 2017-11-04: qty 1

## 2017-11-04 MED ORDER — HYDROMORPHONE HCL 1 MG/ML IJ SOLN
0.2500 mg | INTRAMUSCULAR | Status: DC | PRN
  Administered 2017-11-04 (×2): 0.5 mg via INTRAVENOUS

## 2017-11-04 MED ORDER — METOCLOPRAMIDE HCL 5 MG/ML IJ SOLN: 10.0000 mg | Freq: Once | INTRAMUSCULAR | Status: DC | PRN

## 2017-11-04 MED ORDER — LACTATED RINGERS IR SOLN
Status: DC | PRN
  Administered 2017-11-04: 1000 mL

## 2017-11-04 MED ORDER — MIDAZOLAM HCL 2 MG/2ML IJ SOLN
INTRAMUSCULAR | Status: AC
  Filled 2017-11-04: qty 2

## 2017-11-04 MED ORDER — INSULIN GLARGINE 100 UNIT/ML ~~LOC~~ SOLN
20.0000 [IU] | Freq: Every day | SUBCUTANEOUS | Status: DC
  Administered 2017-11-04: 20 [IU] via SUBCUTANEOUS
  Filled 2017-11-04 (×2): qty 0.2

## 2017-11-04 MED ORDER — SENNOSIDES-DOCUSATE SODIUM 8.6-50 MG PO TABS
2.0000 | ORAL_TABLET | Freq: Every day | ORAL | Status: DC
  Administered 2017-11-04: 2 via ORAL
  Filled 2017-11-04: qty 2

## 2017-11-04 MED ORDER — SUGAMMADEX SODIUM 200 MG/2ML IV SOLN
INTRAVENOUS | Status: DC | PRN
  Administered 2017-11-04: 200 mg via INTRAVENOUS

## 2017-11-04 MED ORDER — FENTANYL CITRATE (PF) 100 MCG/2ML IJ SOLN
25.0000 ug | INTRAMUSCULAR | Status: DC | PRN
  Administered 2017-11-04 (×3): 50 ug via INTRAVENOUS

## 2017-11-04 MED ORDER — LISINOPRIL 20 MG PO TABS
20.0000 mg | ORAL_TABLET | Freq: Every day | ORAL | Status: DC
  Administered 2017-11-05: 20 mg via ORAL
  Filled 2017-11-04: qty 1

## 2017-11-04 MED ORDER — ONDANSETRON HCL 4 MG/2ML IJ SOLN
INTRAMUSCULAR | Status: AC
  Filled 2017-11-04: qty 2

## 2017-11-04 MED ORDER — ROCURONIUM BROMIDE 10 MG/ML (PF) SYRINGE
PREFILLED_SYRINGE | INTRAVENOUS | Status: AC
  Filled 2017-11-04: qty 10

## 2017-11-04 MED ORDER — LACTATED RINGERS IV SOLN: INTRAVENOUS | Status: DC

## 2017-11-04 SURGICAL SUPPLY — 47 items
ADH SKN CLS APL DERMABOND .7 (GAUZE/BANDAGES/DRESSINGS) ×1
AGENT HMST KT MTR STRL THRMB (HEMOSTASIS)
BAG LAPAROSCOPIC 12 15 PORT 16 (BASKET) IMPLANT
BAG RETRIEVAL 12/15 (BASKET)
BAG SPEC RTRVL LRG 6X4 10 (ENDOMECHANICALS)
COVER BACK TABLE 60X90IN (DRAPES) ×3 IMPLANT
COVER TIP SHEARS 8 DVNC (MISCELLANEOUS) ×2 IMPLANT
COVER TIP SHEARS 8MM DA VINCI (MISCELLANEOUS) ×1
DERMABOND ADVANCED (GAUZE/BANDAGES/DRESSINGS) ×1
DERMABOND ADVANCED .7 DNX12 (GAUZE/BANDAGES/DRESSINGS) ×2 IMPLANT
DRAPE ARM DVNC X/XI (DISPOSABLE) ×8 IMPLANT
DRAPE COLUMN DVNC XI (DISPOSABLE) ×2 IMPLANT
DRAPE DA VINCI XI ARM (DISPOSABLE) ×4
DRAPE DA VINCI XI COLUMN (DISPOSABLE) ×1
DRAPE SHEET LG 3/4 BI-LAMINATE (DRAPES) ×3 IMPLANT
DRAPE SURG IRRIG POUCH 19X23 (DRAPES) ×3 IMPLANT
ELECT REM PT RETURN 15FT ADLT (MISCELLANEOUS) ×3 IMPLANT
GLOVE BIO SURGEON STRL SZ 6 (GLOVE) ×12 IMPLANT
GLOVE BIO SURGEON STRL SZ 6.5 (GLOVE) ×3 IMPLANT
GOWN STRL REUS W/ TWL LRG LVL3 (GOWN DISPOSABLE) ×6 IMPLANT
GOWN STRL REUS W/TWL LRG LVL3 (GOWN DISPOSABLE) ×9
HOLDER FOLEY CATH W/STRAP (MISCELLANEOUS) ×3 IMPLANT
IRRIG SUCT STRYKERFLOW 2 WTIP (MISCELLANEOUS) ×3
IRRIGATION SUCT STRKRFLW 2 WTP (MISCELLANEOUS) ×2 IMPLANT
KIT PROCEDURE DA VINCI SI (MISCELLANEOUS) ×1
KIT PROCEDURE DVNC SI (MISCELLANEOUS) ×2 IMPLANT
MANIPULATOR UTERINE 4.5 ZUMI (MISCELLANEOUS) ×3 IMPLANT
NEEDLE SPNL 18GX3.5 QUINCKE PK (NEEDLE) ×3 IMPLANT
OBTURATOR OPTICAL STANDARD 8MM (TROCAR) ×1
OBTURATOR OPTICAL STND 8 DVNC (TROCAR) ×2
OBTURATOR OPTICALSTD 8 DVNC (TROCAR) ×2 IMPLANT
PACK ROBOT GYN CUSTOM WL (TRAY / TRAY PROCEDURE) ×3 IMPLANT
PAD POSITIONING PINK XL (MISCELLANEOUS) ×3 IMPLANT
PORT ACCESS TROCAR AIRSEAL 12 (TROCAR) ×2 IMPLANT
PORT ACCESS TROCAR AIRSEAL 5M (TROCAR) ×1
POUCH SPECIMEN RETRIEVAL 10MM (ENDOMECHANICALS) IMPLANT
SEAL CANN UNIV 5-8 DVNC XI (MISCELLANEOUS) ×8 IMPLANT
SEAL XI 5MM-8MM UNIVERSAL (MISCELLANEOUS) ×4
SET TRI-LUMEN FLTR TB AIRSEAL (TUBING) ×3 IMPLANT
SURGIFLO W/THROMBIN 8M KIT (HEMOSTASIS) IMPLANT
SUT MNCRL AB 4-0 PS2 18 (SUTURE) IMPLANT
SUT VIC AB 0 CT1 27 (SUTURE)
SUT VIC AB 0 CT1 27XBRD ANTBC (SUTURE) IMPLANT
TOWEL OR NON WOVEN STRL DISP B (DISPOSABLE) ×3 IMPLANT
TRAY FOLEY MTR SLVR 16FR STAT (SET/KITS/TRAYS/PACK) ×3 IMPLANT
UNDERPAD 30X30 (UNDERPADS AND DIAPERS) ×6 IMPLANT
WATER STERILE IRR 1000ML POUR (IV SOLUTION) IMPLANT

## 2017-11-04 NOTE — Anesthesia Procedure Notes (Signed)
Procedure Name: Intubation Date/Time: 11/04/2017 7:41 AM Performed by: Montel Clock, CRNA Pre-anesthesia Checklist: Patient identified, Emergency Drugs available, Suction available, Patient being monitored and Timeout performed Patient Re-evaluated:Patient Re-evaluated prior to induction Oxygen Delivery Method: Circle system utilized Preoxygenation: Pre-oxygenation with 100% oxygen Induction Type: IV induction Ventilation: Mask ventilation without difficulty and Oral airway inserted - appropriate to patient size Laryngoscope Size: Mac and 3 Grade View: Grade I Tube type: Oral Tube size: 7.0 mm Number of attempts: 1 Airway Equipment and Method: Stylet Placement Confirmation: ETT inserted through vocal cords under direct vision,  positive ETCO2 and breath sounds checked- equal and bilateral Secured at: 21 cm Tube secured with: Tape Dental Injury: Teeth and Oropharynx as per pre-operative assessment

## 2017-11-04 NOTE — Transfer of Care (Signed)
Immediate Anesthesia Transfer of Care Note  Patient: Deborah Camacho  Procedure(s) Performed: XI ROBOTIC ASSISTED TOTAL HYSTERECTOMY WITH BILATERAL SALPINGO OOPHORECTOMY (Bilateral ) SENTINEL LYMPH NODE BIOPSY (N/A )  Patient Location: PACU  Anesthesia Type:General  Level of Consciousness: drowsy and patient cooperative  Airway & Oxygen Therapy: Patient Spontanous Breathing and Patient connected to face mask oxygen  Post-op Assessment: Report given to RN and Post -op Vital signs reviewed and stable  Post vital signs: Reviewed and stable  Last Vitals:  Vitals Value Taken Time  BP 142/82 11/04/2017  9:31 AM  Temp    Pulse 89 11/04/2017  9:32 AM  Resp 23 11/04/2017  9:32 AM  SpO2 100 % 11/04/2017  9:32 AM  Vitals shown include unvalidated device data.  Last Pain:  Vitals:   11/04/17 9471  TempSrc: Oral      Patients Stated Pain Goal: 3 (25/27/12 9290)  Complications: No apparent anesthesia complications

## 2017-11-04 NOTE — Discharge Instructions (Signed)
11/04/2017  Return to work: 4 weeks  Activity: 1. Be up and out of the bed during the day.  Take a nap if needed.  You may walk up steps but be careful and use the hand rail.  Stair climbing will tire you more than you think, you may need to stop part way and rest.   2. No lifting or straining for 6 weeks.  3. No driving for 1 weeks.  Do Not drive if you are taking narcotic pain medicine.  4. Shower daily.  Use soap and water on your incision and pat dry; don't rub.   5. No sexual activity and nothing in the vagina for 8 weeks.  Medications:  - Take ibuprofen and tylenol first line for pain control. Take these regularly (every 6 hours) to decrease the build up of pain.  - If necessary, for severe pain not relieved by ibuprofen, take percocet.  - While taking percocet you should take sennakot every night to reduce the likelihood of constipation. If this causes diarrhea, stop its use.  Diet: 1. Low sodium Heart Healthy Diet is recommended.  2. It is safe to use a laxative if you have difficulty moving your bowels.   Wound Care: 1. Keep clean and dry.  Shower daily.  Reasons to call the Doctor:   Fever - Oral temperature greater than 100.4 degrees Fahrenheit  Foul-smelling vaginal discharge  Difficulty urinating  Nausea and vomiting  Increased pain at the site of the incision that is unrelieved with pain medicine.  Difficulty breathing with or without chest pain  New calf pain especially if only on one side  Sudden, continuing increased vaginal bleeding with or without clots.   Follow-up: 1. See Everitt Amber in 3-4 weeks.  Contacts: For questions or concerns you should contact:  Dr. Everitt Amber at [phone] After hours and on week-ends call [phone] and ask to speak to the physician on call for Gynecologic Oncology

## 2017-11-04 NOTE — Interval H&P Note (Signed)
History and Physical Interval Note:  11/04/2017 7:15 AM  Deborah Camacho  has presented today for surgery, with the diagnosis of COMPLEX ATYPICAL HYPERPLASIA OF UTERUS  The various methods of treatment have been discussed with the patient and family. After consideration of risks, benefits and other options for treatment, the patient has consented to  Procedure(s): XI ROBOTIC ASSISTED TOTAL HYSTERECTOMY WITH BILATERAL SALPINGO OOPHORECTOMY (Bilateral) SENTINEL LYMPH NODE BIOPSY (N/A) as a surgical intervention .  The patient's history has been reviewed, patient examined, no change in status, stable for surgery.  I have reviewed the patient's chart and labs. Her labs reveal that her diabetes is not well controlled (Hb A1C of 9%. I discussed this with the patient and hte fact that this is associated with increased complication risks. I encouraged her to optimize diet control perioperatively to minimize morbidity. Her labs also reveal that she has some diabetic nephropathy with a baseline creatinine of 1.2). Questions were answered to the patient's satisfaction.     Thereasa Solo

## 2017-11-04 NOTE — Anesthesia Preprocedure Evaluation (Signed)
Anesthesia Evaluation  Patient identified by MRN, date of birth, ID band Patient awake    Reviewed: Allergy & Precautions, NPO status , Patient's Chart, lab work & pertinent test results  Airway Mallampati: II  TM Distance: >3 FB Neck ROM: Full    Dental no notable dental hx.    Pulmonary neg pulmonary ROS,    Pulmonary exam normal breath sounds clear to auscultation       Cardiovascular hypertension, Pt. on medications Normal cardiovascular exam Rhythm:Regular Rate:Normal     Neuro/Psych negative neurological ROS  negative psych ROS   GI/Hepatic negative GI ROS, Neg liver ROS,   Endo/Other  diabetes, Type 2, Oral Hypoglycemic Agents  Renal/GU negative Renal ROS  negative genitourinary   Musculoskeletal negative musculoskeletal ROS (+)   Abdominal   Peds negative pediatric ROS (+)  Hematology negative hematology ROS (+)   Anesthesia Other Findings   Reproductive/Obstetrics negative OB ROS                             Anesthesia Physical Anesthesia Plan  ASA: II  Anesthesia Plan: General   Post-op Pain Management:    Induction: Intravenous  PONV Risk Score and Plan: 4 or greater and Ondansetron, Dexamethasone, Midazolam and Treatment may vary due to age or medical condition  Airway Management Planned: Oral ETT  Additional Equipment:   Intra-op Plan:   Post-operative Plan: Extubation in OR  Informed Consent: I have reviewed the patients History and Physical, chart, labs and discussed the procedure including the risks, benefits and alternatives for the proposed anesthesia with the patient or authorized representative who has indicated his/her understanding and acceptance.   Dental advisory given  Plan Discussed with: CRNA  Anesthesia Plan Comments:         Anesthesia Quick Evaluation

## 2017-11-04 NOTE — Op Note (Signed)
OPERATIVE NOTE 11/04/17  Surgeon: Donaciano Eva   Assistants: Dr Lahoma Crocker (an MD assistant was necessary for tissue manipulation, management of robotic instrumentation, retraction and positioning due to the complexity of the case and hospital policies).   Anesthesia: General endotracheal anesthesia  ASA Class: 3   Pre-operative Diagnosis: endometrial complex hyperplasia with atypia.  Post-operative Diagnosis: same and endometrial polyps on frozen section  Operation: Robotic-assisted laparoscopic total hysterectomy with bilateral salpingoophorectomy   Surgeon: Donaciano Eva  Assistant Surgeon: Lahoma Crocker MD  Anesthesia: GET  Urine Output: 200cc  Operative Findings:  : 6cm uterus with adhesions to the anterior abdominal wall/bladder. Sigmoid adhesions to the left adnexa.  Frozen section revealed no invasive carcinoma, only polyps. There was unilateral mapping to the right.  Estimated Blood Loss:  less than 50 mL      Total IV Fluids: 700 ml         Specimens: uterus, cervix, bilateral tubes and ovaries, right obturator SLN, right external iliac SLN         Complications:  None; patient tolerated the procedure well.         Disposition: PACU - hemodynamically stable.  Procedure Details  The patient was seen in the Holding Room. The risks, benefits, complications, treatment options, and expected outcomes were discussed with the patient.  The patient concurred with the proposed plan, giving informed consent.  The site of surgery properly noted/marked. The patient was identified as Deborah Camacho and the procedure verified as a Robotic-assisted hysterectomy with bilateral salpingo oophorectomy. A Time Out was held and the above information confirmed.  After induction of anesthesia, the patient was draped and prepped in the usual sterile manner. Pt was placed in supine position after anesthesia and draped and prepped in the usual sterile manner. The  abdominal drape was placed after the CholoraPrep had been allowed to dry for 3 minutes.  Her arms were tucked to her side with all appropriate precautions.  The shoulders were stabilized with padded shoulder blocks applied to the acromium processes.  The patient was placed in the semi-lithotomy position in [place].  The perineum was prepped with Betadine. The patient was then prepped. Foley catheter was placed.  A sterile speculum was placed in the vagina.  The cervix was grasped with a single-tooth tenaculum and dilated with Kennon Rounds dilators. 1mg  total of ICG was injected into the cervical stroma at 2 and 9 o'clock at a 76mm depth (concentration 0..5mg /ml).  The ZUMI uterine manipulator with a medium colpotomizer ring was placed without difficulty.  A pneum occluder balloon was placed over the manipulator.  OG tube placement was confirmed and to suction.   Next, a 5 mm skin incision was made 1 cm below the subcostal margin in the midclavicular line.  The 5 mm Optiview port and scope was used for direct entry.  Opening pressure was under 10 mm CO2.  The abdomen was insufflated and the findings were noted as above.   At this point and all points during the procedure, the patient's intra-abdominal pressure did not exceed 15 mmHg. Next, a 10 mm skin incision was made in the umbilicus and a right and left port was placed about 10 cm lateral to the robot port on the right and left side.  A fourth arm was placed in the left lower quadrant 2 cm above and superior and medial to the anterior superior iliac spine.  All ports were placed under direct visualization.  The patient was placed  in steep Trendelenburg.  Bowel was folded away into the upper abdomen.  The robot was docked in the normal manner.  The right and left peritoneum were opened parallel to the IP ligament to open the retroperitoneal spaces bilaterally. The SLN mapping was performed in bilateral pelvic basins. The para rectal and paravesical spaces were  opened up. Lymphatic channels were identified travelling to the following visualized sentinel lymph node's: right obturator and external iliac SLN. These SLN's were separated from their surrounding lymphatic tissue, removed and sent for permanent pathology.  The hysterectomy was started after the round ligament on the right side was incised and the retroperitoneum was entered and the pararectal space was developed.  The ureter was noted to be on the medial leaf of the broad ligament.  The peritoneum above the ureter was incised and stretched and the infundibulopelvic ligament was skeletonized, cauterized and cut.  The posterior peritoneum was taken down to the level of the KOH ring.  The anterior peritoneum was also taken down.  The bladder flap was created to the level of the KOH ring.  The uterine artery on the right side was skeletonized, cauterized and cut in the normal manner.  A similar procedure was performed on the left.  The colpotomy was made and the uterus, cervix, bilateral ovaries and tubes were amputated and delivered through the vagina.  Pedicles were inspected and excellent hemostasis was achieved.   Frozen section revealed no invasive carcinoma. The colpotomy at the vaginal cuff was closed with Vicryl on a CT1 needle in a running manner.  Irrigation was used and excellent hemostasis was achieved.  At this point in the procedure was completed.  Robotic instruments were removed under direct visulaization.  The robot was undocked. The 10 mm ports were closed with Vicryl on a UR-5 needle and the fascia was closed with 0 Vicryl on a UR-5 needle.  The skin was closed with 4-0 Vicryl in a subcuticular manner.  Dermabond was applied.  Sponge, lap and needle counts correct x 2.  The patient was taken to the recovery room in stable condition.  The vagina was swabbed with  minimal bleeding noted.   All instrument and needle counts were correct x  3.   The patient was transferred to the recovery room in  a stable condition.  Donaciano Eva, MD

## 2017-11-05 DIAGNOSIS — N8502 Endometrial intraepithelial neoplasia [EIN]: Secondary | ICD-10-CM | POA: Diagnosis not present

## 2017-11-05 LAB — CBC
HCT: 37.3 % (ref 36.0–46.0)
Hemoglobin: 11.6 g/dL — ABNORMAL LOW (ref 12.0–15.0)
MCH: 27.9 pg (ref 26.0–34.0)
MCHC: 31.1 g/dL (ref 30.0–36.0)
MCV: 89.7 fL (ref 78.0–100.0)
Platelets: 196 10*3/uL (ref 150–400)
RBC: 4.16 MIL/uL (ref 3.87–5.11)
RDW: 14.3 % (ref 11.5–15.5)
WBC: 8.2 10*3/uL (ref 4.0–10.5)

## 2017-11-05 LAB — GLUCOSE, CAPILLARY
Glucose-Capillary: 101 mg/dL — ABNORMAL HIGH (ref 70–99)
Glucose-Capillary: 101 mg/dL — ABNORMAL HIGH (ref 70–99)

## 2017-11-05 LAB — BASIC METABOLIC PANEL
Anion gap: 9 (ref 5–15)
BUN: 20 mg/dL (ref 8–23)
CHLORIDE: 104 mmol/L (ref 98–111)
CO2: 27 mmol/L (ref 22–32)
CREATININE: 1.25 mg/dL — AB (ref 0.44–1.00)
Calcium: 8.8 mg/dL — ABNORMAL LOW (ref 8.9–10.3)
GFR, EST AFRICAN AMERICAN: 52 mL/min — AB (ref >60)
GFR, EST NON AFRICAN AMERICAN: 45 mL/min — AB (ref >60)
Glucose, Bld: 137 mg/dL — ABNORMAL HIGH (ref 70–99)
POTASSIUM: 4.1 mmol/L (ref 3.5–5.1)
SODIUM: 140 mmol/L (ref 135–145)

## 2017-11-05 MED ORDER — OXYCODONE-ACETAMINOPHEN 10-325 MG PO TABS: 1.0000 | ORAL_TABLET | ORAL | 0 refills | Status: AC | PRN

## 2017-11-05 NOTE — Discharge Summary (Signed)
Physician Discharge Summary  Patient ID: Deborah Camacho MRN: [id number] DOB/AGE: May 24, 1955 62 y.o.  Admit date: 11/04/2017 Discharge date: 11/05/2017  Admission Diagnoses: Endometrial hyperplasia  Discharge Diagnoses:  Principal Problem:   Endometrial hyperplasia Active Problems:   Obesity (BMI 30-39.9)   Complex endometrial hyperplasia with atypia   Discharged Condition: good  Hospital Course: On 11/04/2017, the patient underwent the following: Procedure(s): XI ROBOTIC ASSISTED TOTAL HYSTERECTOMY WITH BILATERAL SALPINGO OOPHORECTOMY SENTINEL LYMPH NODE BIOPSY.   The postoperative course was uneventful.  Her CBGs remained in range, <180 mg/dL.  She was discharged to home on postoperative day 1 tolerating a regular diet, meeting all postoperative goals.  Consults: None  Significant Diagnostic Studies: None  Treatments: surgery: see above  Discharge Exam: Blood pressure 114/66, pulse 84, temperature 98.1 F (36.7 C), temperature source Oral, resp. rate 15, height _0  (1.6 m), weight 158 lb (71.7 kg), SpO2 100 %. General appearance: alert Resp: clear to auscultation bilaterally Cardio: regular rate and rhythm, S1, S2 normal, no murmur, click, rub or gallop GI: soft, non-tender; bowel sounds normal; no masses,  no organomegaly Extremities: extremities normal, atraumatic, no cyanosis or edema and Homans sign is negative, no sign of DVT Incision/Wound: C/D/I  Disposition:   Discharge Instructions    Call MD for:  difficulty breathing, headache or visual disturbances   Complete by:  As directed    Call MD for:  extreme fatigue   Complete by:  As directed    Call MD for:  persistant dizziness or light-headedness   Complete by:  As directed    Call MD for:  persistant nausea and vomiting   Complete by:  As directed    Call MD for:  redness, tenderness, or signs of infection (pain, swelling, redness, odor or green/yellow discharge around incision site)   Complete by:  As  directed    Call MD for:  severe uncontrolled pain   Complete by:  As directed    Call MD for:  temperature >100.4   Complete by:  As directed    Diet general   Complete by:  As directed    Discharge instructions   Complete by:  As directed    Planning for Recovery and Going Home Your Guide to Gynecologic Surgery    In-Hospital Recovery Plan Team Caring for You After Surgery In addition to the nursing staff on the unit, the gynecological surgery team will care for you. This team is led by your surgeon and includes medical students and a physician assistant or nurse practitioner. There will be a physician in the hospital 24 hours a day to tend to your needs. The students report directly to your surgeon, who is the one overseeing all of your care.  Pain Relief After Surgery Your pain will be assessed regularly on a scale from 0 to 10. Pain assessment is  necessary to guide your pain relief. It is essential that you are able to take deep breaths, cough and move. Prevention or early treatment of pain is far more effective than trying to treat severe pain. Therefore, we have devised a specialized regimen to stay ahead of your pain and use almost no narcotics, which can slow down your recovery process. If you have an epidural catheter, you will receive a  constant infusion of pain medication through your epidural. If you need additional pain relief, you will be able to push a button to increase the medication in your epidural. You will also be given acetaminophen and  an ibuprofen-like medication to keep your pain under control.  You can always ask for additional pain pills if you are not comfortable. In most cases an anesthesiologist with expertise in pain management will visit you every day and help design your pain management plan.  One Day After Surgery Focus on drinking and walking. You will start drinking clear liquids after surgery. The intravenous fluids will be stopped, and  the catheter may be removed  from your bladder. We expect you to get out of bed, with the nurses' or assistants' help, sit in a chair for six hours and start to move about in the hallways. You will also meet with a case manager to assess your discharge needs, including home nursing. Your physician may order home care to assist with your transition home.  Home nursing visits, which are intermittent, help you get readjusted to home by teaching treatments, monitoring medications, and performing clinical assessment and reporting back to your physician. Other services may include therapy and medical equipment; private duty services are also available. If you are going "home" to a different address upon discharge, please alert [place]. A Home Care Coordinator can visit with you while in the hospital to discuss your options. If you have questions please speak with your case manager. If you need rehabilitation at a facility, a social worker will assist with this. If you need rehabilitation at a facility, a social worker will assist with this. If your procedure was performed in a minimally invasive fashion, you will be discharged to home if your pain is well controlled and you are tolerating a regular diet.     Two Days After Surgery You will start eating a soft diet and change to a more solid diet as you feel up to it. The catheter from your bladder will be removed, if not already done so. If there is a dressing on your wound, it will be removed. The tubing will be disconnected from your IV. We expect you to be out of bed for the majority of the day and walking at least three times in the hallway, with assistance as needed.  You may be discharged at this point if it is felt you are ready.   Three Days After Surgery You continue to eat your low residue diet. You may be ready to go home if you are drinking enough to keep yourself hydrated, your pain is well controlled, you are not belching or nauseated, you  are passing gas and you are able to get around on your own. However, we will not discharge you from the hospital until we are sure you are ready.  Discharge Discharge time is at 10 a.m. You will need to make arrangements for someone to accompany you home. You will not be released without someone present. Please keep in mind that we strive to get patients discharged as quickly as possible, but there may be delays for a variety of reasons. Complications That May Delay Discharge: ? Nausea and vomiting: It is very common to feel sick after your surgery. We give you medication to reduce this. However, if you do feel sick, you should reduce the amount you are taking by mouth. Small, frequent meals or drinks are best in this  situation. As long as you can drink and keep yourself hydrated, the nausea will likely pass.  Ileus: Following surgery, the bowel can be sluggish, making it difficult for food and gas to pass through the intestines. This is called an ileus. We have  designed our care program to do everything possible to reduce the likelihood of an ileus. If you do develop an ileus, it usually only lasts two to three days. However, it may require a small tube down the nose to decompress the stomach. The best way to avoid an ileus is to reduce the amount of narcotic pain medications, get up as much as possible after your surgery, and stimulate the bowel early after surgery with small amounts of food and liquids.  Wound infection: If a wound infection develops, this usually happens three to ten days after surgery.    Urinary retention: This is if you are unable to urinate after the catheter from your bladder is removed. The catheter may need to be reinserted until you are able to urinate on your own. This can be caused by anesthesia, pain medication and decreased activity.    When you are preparing to go home, you will receive:  Detailed discharge instructions, with information about your  operation and medications    All prescriptions for medications you need at home; prescriptions can be filled while you are in the hospital if you would like    You may be prescribed Lovenox. Lovenox is used to reduce the risk of developing a blood clot after surgery. An appointment to see your surgeon or provider one to two weeks after you leave the hospital for follow-up   After Discharge Once you are discharged: Call us at any time if you are worried about your recovery or if you should have any questions. During regular office hours, (8:30 a.m.-4:30 p.m.), and after hours call [phone].  Call us immediately if:  You have a fever higher than 100.4 degrees.   Your wound is red, more painful or has drainage.    You are nauseated, vomiting or can't keep liquids down.    Your pain is worse and not able to be controlled with the regimen you were sent home with.    If you are bleeding heavily or have a lot of fluid coming from your vagina. If you are on narcotics, the goal is to wean you off of them. If you are running low on supply and need more, call the nurse a few days before you will run out.  It is generally easier to reach someone between 8:30 a.m. - 4:30 p.m., so call early if you think something is not right. A nurse or nurse practitioner is available every day to answer your questions. After hours and on the weekends, the calls go to the resident doctors in the hospital. It may take longer for your phone call to be returned during this time. If you have a true emergency, such as severe abdominal pain, chest pain, shortness of breath or any other acute issues, call 911 and go to the local emergency room. Have them contact our team once you are stable.  Concerns After Discharge Bowel Function Following Your Surgery Your bowels will take several weeks to settle down and may be unpredictable at first. Your bowel movements may become loose, or you may be  constipated. For the vast number of patients, this will get back to normal with time. Make sure you eat nutritious meals, drink plenty of fluids and take regular walks during the first two weeks after your operation. Your Guide to Gynecologic Surgery   Abdominal Pain It is not unusual to suffer gripping pains (colic) during the first week following removal of a portion of your bowel. This pain usually  lasts for a few minutes but goes away between spasms. If you have severe pain lasting more than one to two hours or have a fever and feel generally unwell, you should contact us at  the telephone contact numbers listed at the end of this packet. Hysterectomy: You should have pelvic rest for six (6) weeks or as specified by your doctor after surgery. You should have nothing in the vagina (no tampons, douching, intercourse, etc.,) during this time period. If you have some vaginal spotting, this is normal. If you have heavy bleeding or  a lot of fluid from your vagina, this is NOT normal and you should contact your doctor's office or, if after hours, contact the doctor on call.  Diarrhea: Fiber and Imodium (Loperamide) The first step to improving your frequent or loose stools is to bulk up the stool with fiber. Metamucil is the most common type of fiber that is available at any drug store. Start with 1 teaspoon mixed into food, like yogurt or oatmeal, in the morning and evening. Try not to drink any fluid for one hour after you take the fiber. This will allow the fiber to act like a sponge in your intestines, soaking up all the excess water. Continue this for three to five days. You may increase by 1 teaspoon every three to five days until the desired affect, or you are at 1 tablespoon (3 teaspoons) twice a day. If this doesn't work, you may try over-the-counter Loperamide, which is an antidiarrheal medication. You may take one tablet in the morning and evening or 30 minutes before  you typically have diarrhea. You may take up to eight of these tablets daily. It is best to discuss this with [place] prior to using this medication. If you have continuous diarrhea and abdominal cramping, call [phone].  Foley Catheter Your surgeon may recommend you be discharged home with a foley catheter (bladder catheter) for 1 to 2 weeks. Typically this recommendation will be made for patients undergoing surgery to the lower urinary tract. Before you leave the hospital, your nurse should outfit you with a clip on the inner thigh to secure the catheter to prevent pulling as well as a small bag that can be easily worn on the upper leg under loose fitting pants and skirts. Your nurse will teach you how to exchange the large bag that typically comes with the catheter for the small bag. You may find it convenient to attach the small bag when active during the day and then the large bag when sleeping at night. If there is ever a point when you notice the catheter is not draining urine and youbegin to develop pain behind/above the pubic bone, you should report to the clinic or emergency room immediately as the catheter may be kinked or clogged. Kinking or clogging of the catheter prevents urine from draining from your bladder. Urine will quickly build up in the bladder and can cause severe pain as well as seriously disrupt healing if you have undergone surgery on the lower urinary tract. Additionally, pulling on the catheter can result in displacement of the balloon at the end of the catheter from inside of to outside  of the bladder. This also results in severe pain and can cause bleeding. For this reason, secure the catheter to the clip on your inner thigh at all times as the clip prevents against pulling.  Wound Care For the first few weeks following surgery, your wound may be slightly red and uncomfortable.  You may shower and let the soapy water wash over your incision. Avoid soaking in the  tub for one month following surgery or until the wound is well healed. It will take the wound several months to "soften." It is common to have bumpy areas in the wound near the belly  button and at the ends of the incision.  If you have staples, these should be removed when you are seen by your surgeon at the follow-up appointment. You may have a glue-like material on your incision. Do not pick at this. It will come off over time. It is the surgical glue used in surgery to close your incision. You also have sutures inside of you that will dissolve over time  Post-Surgery Diet Attention to good nutrition after surgery is important to your recovery. If you had no dietary restrictions prior to the surgery, you will have no special dietary restrictions after the surgery. However, consuming enough protein, calories, vitamins and minerals is necessary to support healing. Some patients find their appetite is less than normal after surgery. In this case, frequent small meals throughout the day may help. It is not uncommon to lose 10 to 15 pounds after surgery. However, by the fourth to fifth week, your weight loss should stabilize. It is normal that certain foods taste different and certain smells may make you nauseas. Over time, the amount you can comfortably consume will gradually increase. You should try to eat a balanced diet, which includes:  Foods that are soft, moist, and easy to chew and swallow    Canned or soft-cooked fruits and vegetables   Plenty of soft breads, rice, pasta, potatoes and other starchy foods (lowerfiber  varieties may be tolerated better initially)    High-protein foods and beverages, such as meats, eggs, milk, cottage cheese  or a supplemental nutrition drink like Boost or Ensure    Drink plenty of fluids-at least 8 to 10 cups per day. This includes water,  fruit juice, Gatorade, teas/coffee and milk. Drinking plenty is especially important if you have loose  stools (diarrhea).   Avoid drinking a lot of caffeine, since this may dehydrate you.    Avoid fried, greasy and highly seasoned or spicy foods.    Avoid carbonated beverages in the first couple weeks.    Avoid raw fruits and vegetables.   Hobbies/Activities Walking is encouraged after your surgery. You should plan to undertake regular exercise several times a day and gradually increase this during the four weeks following your operation until you are back to your normal level of activity. You may climb stairs. Don't do any heavy lifting greater than 10 pounds or contact sports for the first month after your surgery. Generally, you can return to hobbies and activities soon after your surgery. This will help you recover. It can take up to two to three months to fully recover. It is not unusual to be fatigued and require an afternoon nap for up to six to eight weeks following surgery. Your body is using this energy to heal your wounds. Set small goals for yourself and try to do a little more each day.  Work It is normal to return to work three to six weeks following your operation. If your job involves heavy manual work, then you should wait six weeks. However, you should check with your employer regarding rules, which may be relevant to your return to work. If you need a return-to-work form for your employer or disability papers, bring them to  your follow-up appointment or fax them to our office at [phone].  Driving You may drive when you are off narcotics and pain-free enough to react quickly with your braking foot. For most patients, this occurs at one to four weeks following surgery.   Write down any questions you may have to ask your care team.  Important Contact Numbers: GYN Oncology Office: [phone]   Driving Restrictions   Complete by:  As directed    No driving x 72 hrs   Increase activity slowly   Complete by:  As directed    Gradually increase over 2 - 7  days   Lifting restrictions   Complete by:  As directed    Avoid lifting > 20 lbs x 2 weeks.   May shower / Bathe   Complete by:  As directed    May walk up steps   Complete by:  As directed    No dressing needed   Complete by:  As directed    Other Restrictions   Complete by:  As directed    Exercise in 5 - 10 days.  Swim in 7 days.  Douching is not recommended.   Sexual Activity Restrictions   Complete by:  As directed    No intercourse x 8 weeks     Allergies as of 11/05/2017   No Known Allergies     Medication List    TAKE these medications   glimepiride 4 MG tablet Commonly known as:  AMARYL Take 4 mg by mouth at bedtime.   INVOKANA 100 MG Tabs tablet Generic drug:  canagliflozin Take 100 mg by mouth daily.   lisinopril-hydrochlorothiazide 20-25 MG tablet Commonly known as:  PRINZIDE,ZESTORETIC Take 1 tablet by mouth daily.   multivitamin with minerals Tabs tablet Take 1 tablet by mouth daily.   NARCAN 4 MG/0.1ML Liqd nasal spray kit Generic drug:  naloxone CALL 911. ADMINISTER A SINGLE SPRAY OF NARCAN IN ONE NOSTRIL. REPEAT EVERY 3 MINUTES AS NEEDED IF NO OR MINIMAL RESPONSE.   Oxycodone HCl 10 MG Tabs Take 10 mg by mouth every 8 (eight) hours as needed (pain).   oxyCODONE-acetaminophen 10-325 MG tablet Commonly known as:  PERCOCET Take 1 tablet by mouth every 4 (four) hours as needed for pain.   traMADol 50 MG tablet Commonly known as:  ULTRAM Take 50-100 mg by mouth every 8 hours as needed for pain   TRULICITY 4.80 XK/5.5VZ Sopn Generic drug:  Dulaglutide Inject 0.75 mg as directed every Wednesday.      Follow-up Information    Everitt Amber, MD Follow up on 11/19/2017.   Specialty:  Gynecologic Oncology Contact information: [address] [place] [address] [phone]           Signed: Lahoma Crocker 11/05/2017, 5:36 PM

## 2017-11-05 NOTE — Discharge Planning (Signed)
Patient IV removed.  RN assessment and VS revealed stability for DC to home.  Discharge papers given, explained and educated on surgical sites/ self care, s/sx of infection and when to contact Dr. with concerns.  Patient urinated prior to DC and after Foley was [facility].  Informed of suggested FU appt and appt made.  Once ready, patient will be wheeled to front and family transporting home via car. Waiting on ride to arrive.

## 2017-11-05 NOTE — Anesthesia Postprocedure Evaluation (Signed)
Anesthesia Post Note  Patient: Deborah Camacho  Procedure(s) Performed: XI ROBOTIC ASSISTED TOTAL HYSTERECTOMY WITH BILATERAL SALPINGO OOPHORECTOMY (Bilateral ) SENTINEL LYMPH NODE BIOPSY (N/A )     Patient location during evaluation: PACU Anesthesia Type: General Level of consciousness: awake and alert Pain management: pain level controlled Vital Signs Assessment: post-procedure vital signs reviewed and stable Respiratory status: spontaneous breathing, nonlabored ventilation, respiratory function stable and patient connected to nasal cannula oxygen Cardiovascular status: blood pressure returned to baseline and stable Postop Assessment: no apparent nausea or vomiting Anesthetic complications: no    Last Vitals:  Vitals:   11/05/17 0858 11/05/17 0940  BP: 110/62 114/66  Pulse:  84  Resp:  15  Temp:  36.7 C  SpO2:  100%    Last Pain:  Vitals:   11/05/17 0940  TempSrc: Oral  PainSc:                  Montez Hageman

## 2017-11-08 ENCOUNTER — Telehealth: Payer: Self-pay

## 2017-11-08 NOTE — Telephone Encounter (Signed)
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 The lymph nodes were clear.  No further treatment is needed per Joylene John, NP.

## 2017-11-18 ENCOUNTER — Telehealth: Payer: Self-pay | Admitting: *Deleted

## 2017-11-18 NOTE — Telephone Encounter (Signed)
Called and left a message to call the office back. Need to move the appt for tomorrow afternoon to either tomorrow at 12pm to next week.

## 2017-11-18 NOTE — Telephone Encounter (Signed)
Patient called back and moved her appt for tomorrow up

## 2017-11-19 ENCOUNTER — Inpatient Hospital Stay: Payer: Medicare Other | Attending: Gynecologic Oncology | Admitting: Gynecologic Oncology

## 2017-11-19 ENCOUNTER — Encounter: Payer: Self-pay | Admitting: Gynecologic Oncology

## 2017-11-19 VITALS — BP 107/63 | HR 99 | Temp 98.1°F | Resp 18 | Ht 63.0 in | Wt 158.1 lb

## 2017-11-19 DIAGNOSIS — N8500 Endometrial hyperplasia, unspecified: Secondary | ICD-10-CM | POA: Insufficient documentation

## 2017-11-19 DIAGNOSIS — Z90722 Acquired absence of ovaries, bilateral: Secondary | ICD-10-CM

## 2017-11-19 DIAGNOSIS — B3731 Acute candidiasis of vulva and vagina: Secondary | ICD-10-CM

## 2017-11-19 DIAGNOSIS — Z90710 Acquired absence of both cervix and uterus: Secondary | ICD-10-CM

## 2017-11-19 DIAGNOSIS — B373 Candidiasis of vulva and vagina: Secondary | ICD-10-CM

## 2017-11-19 MED ORDER — FLUCONAZOLE 150 MG PO TABS: 150.0000 mg | ORAL_TABLET | Freq: Once | ORAL | 0 refills | Status: AC

## 2017-11-19 MED ORDER — MICONAZOLE NITRATE 2 % EX CREA: 1.0000 "application " | TOPICAL_CREAM | Freq: Two times a day (BID) | CUTANEOUS | 0 refills | Status: DC

## 2017-11-19 NOTE — Progress Notes (Signed)
Follow Up Post-op Note: Gyn-Onc  Deborah Camacho 62 y.o. female  CC:  Chief Complaint  Patient presents with  . 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  She reports going into menopause during her 24s but continued to have vaginal spotting intermittently and postcoitally through 2019.  She was seen by Dr. Deatra Ina in June 2019 for further evaluation.  A TVUS was performed on 10/18/17 which showed a uterus measuring 8x4x3cm with a thickened endometrium.   A pap smear on the same day was normal and was HPV(high risk) negative.  An endometrial pipelle biopsy on 10/18/17 showed complex hyperplasia with atypia, and squamous metaplasia at least, including a polyp. Well differentiated endometrioid adenocarcinoma cannot be excluded.   Her past medical history includes 5 prior vaginal deliveries and diabetes mellitus which she takes glimepiride and invokana for. She has neuropathy in her feet but denies other diabetic sequelae. Surgical history includes a tubal ligation. She has a family history on her maternal side significant for a maternal aunt with breast cancer, an uncle with pancreatic cancer, an uncle with lung cancer, a grandmother with lung cancer and her sister had pancreatic cancer.   On 11/04/17, she underwent a robotic-assisted laparoscopic total hysterectomy with bilateral salpingoophorectomy with Dr. Everitt Amber.  Her post-operative course was uneventful. Her final pathology resulted: Uterus, cervix and bilateral fallopian tubes, and ovaries - UTERUS: -ENDOMETRIUM: COMPLEX ATYPICAL HYPERPLASIA. ENDOMETRIAL POLYP. -MYOMETRIUM: LEIOMYOMATA. NO MALIGNANCY. -SEROSA: UNREMARKABLE. NO MALIGNANCY. - CERVIX: BENIGN SQUAMOUS AND ENDOCERVICAL MUCOSA. NO DYSPLASIA OR MALIGNANCY. - BILATERAL OVARIES: UNREMARKABLE. NO MALIGNANCY. - BILATERAL FALLOPIAN TUBES: UNREMARKABLE. NO MALIGNANCY. 2. Lymph node,  sentinel, biopsy, right obturator - ONE OF ONE LYMPH NODES NEGATIVE FOR MALIGNANCY (0/1). 3. Lymph node, sentinel, biopsy, right external iliac - ONE OF ONE LYMPH NODES NEGATIVE FOR MALIGNANCY (0/1).  Interval History:  She presents today for post-operative follow up.  She states she has been doing well.  Tolerating diet with no nausea or emesis reported and reports increase in appetite after surgery compared with pre-op status.  Ambulating without difficulty.  States she has chronic pain in her legs but it has not changed.  No swelling reported in her legs.  States she is voiding without difficulty but states she has had three BMs since surgery.  She states she experienced constipation before surgery as well but has not had a colonoscopy.  She has not taken anything to stimulate her bowels and denies rectal bleeding.  No vaginal bleeding or discharge reported.  Incisions healing without issue.  Asking about driving.  States her blood glucose readings have been "pretty good."  Patient reports vulvar itching with white discharge that began after surgery.  No other concerns voiced.  Review of Systems Constitutional: Feels well. Denies fever, chills, early satiety.  Reports good appetite. Cardiovascular: No chest pain, shortness of breath, or edema.  Pulmonary: No cough or wheeze.  Gastrointestinal: No nausea, vomiting, or diarrhea. No bright red blood per rectum. Positive for constipation after surgery.  Genitourinary: No frequency, urgency, or dysuria. No vaginal bleeding or discharge.  Musculoskeletal: Chronic pain in bilateral legs. Neurologic: No weakness, numbness, or change in gait.  Psychology: No depression, anxiety, or insomnia.  Current Meds:  Outpatient Encounter Medications as of 11/19/2017  Medication Sig  . glimepiride (AMARYL) 4 MG tablet Take 4 mg by mouth at bedtime.   . INVOKANA 100 MG TABS tablet Take  100 mg by mouth daily.   Marland Kitchen lisinopril-hydrochlorothiazide (PRINZIDE,ZESTORETIC)  20-25 MG tablet Take 1 tablet by mouth daily.   . Multiple Vitamin (MULTIVITAMIN WITH MINERALS) TABS tablet Take 1 tablet by mouth daily.  Marland Kitchen NARCAN 4 MG/0.1ML LIQD nasal spray kit CALL 911. ADMINISTER A SINGLE SPRAY OF NARCAN IN ONE NOSTRIL. REPEAT EVERY 3 MINUTES AS NEEDED IF NO OR MINIMAL RESPONSE.  Marland Kitchen Oxycodone HCl 10 MG TABS Take 10 mg by mouth every 8 (eight) hours as needed (pain).  Marland Kitchen oxyCODONE-acetaminophen (PERCOCET) 10-325 MG tablet Take 1 tablet by mouth every 4 (four) hours as needed for pain.  . traMADol (ULTRAM) 50 MG tablet Take 50-100 mg by mouth every 8 hours as needed for pain  . TRULICITY 9.60 AV/4.0JW SOPN Inject 0.75 mg as directed every Wednesday.  . fluconazole (DIFLUCAN) 150 MG tablet Take 1 tablet (150 mg total) by mouth once for 1 dose.  . miconazole (MICOTIN) 2 % cream Apply 1 application topically 2 (two) times daily. To vulva for itching   No facility-administered encounter medications on file as of 11/19/2017.     Allergy: No Known Allergies  Social Hx:   Social History   Socioeconomic History  . Marital status: Legally Separated    Spouse name: Not on file  . Number of children: Not on file  . Years of education: Not on file  . Highest education level: Not on file  Occupational History  . Not on file  Social Needs  . Financial resource strain: Not on file  . Food insecurity:    Worry: Not on file    Inability: Not on file  . Transportation needs:    Medical: Not on file    Non-medical: Not on file  Tobacco Use  . Smoking status: Never Smoker  . Smokeless tobacco: Never Used  Substance and Sexual Activity  . Alcohol use: No  . Drug use: Never  . Sexual activity: Not Currently  Lifestyle  . Physical activity:    Days per week: Not on file    Minutes per session: Not on file  . Stress: Not on file  Relationships  . Social connections:    Talks on phone: Not on file    Gets together: Not on file    Attends religious service: Not on file     Active member of club or organization: Not on file    Attends meetings of clubs or organizations: Not on file    Relationship status: Not on file  . Intimate partner violence:    Fear of current or ex partner: Not on file    Emotionally abused: Not on file    Physically abused: Not on file    Forced sexual activity: Not on file  Other Topics Concern  . Not on file  Social History Narrative   Social History      Diet?        Do you drink/eat things with caffeine? yes      Marital status?                                    What year were you married?      Do you live in a house, apartment, assisted living, condo, trailer, etc.? apartment      Is it one or more stories?      How many persons live in your home? me  Do you have any pets in your home? (please list) one dog      Highest level of education completed?      Current or past profession:      Do you exercise?                 no                     Type & how often?      Advanced Directives      Do you have a living will? no      Do you have a DNR form?           no                       If not, do you want to discuss one?      Do you have signed POA/HPOA for forms?  no      Functional Status      Do you have difficulty bathing or dressing yourself?      Do you have difficulty preparing food or eating?       Do you have difficulty managing your medications?      Do you have difficulty managing your finances?      Do you have difficulty affording your medications?    Past Surgical Hx:  Past Surgical History:  Procedure Laterality Date  . CARPAL TUNNEL RELEASE Bilateral   . ROBOTIC ASSISTED TOTAL HYSTERECTOMY WITH BILATERAL SALPINGO OOPHERECTOMY Bilateral 11/04/2017   Procedure: XI ROBOTIC ASSISTED TOTAL HYSTERECTOMY WITH BILATERAL SALPINGO OOPHORECTOMY;  Surgeon: Everitt Amber, MD;  Location: [facility];  Service: Gynecology;  Laterality: Bilateral;  . SENTINEL NODE BIOPSY N/A 11/04/2017   Procedure:  SENTINEL LYMPH NODE BIOPSY;  Surgeon: Everitt Amber, MD;  Location: [facility];  Service: Gynecology;  Laterality: N/A;  . TUBAL LIGATION  1978    Past Medical Hx:  Past Medical History:  Diagnosis Date  . Arthritis   . Arthritis    both legs  . Back pain   . Diabetes mellitus    TYPE 2  . Dyslipidemia   . Hand pain   . Hyperlipidemia   . Hypertension   . Leg pain   . Vitamin D deficiency     Family Hx:  Family History  Problem Relation Age of Onset  . Hypertension Other   . Cancer Other   . Pancreatic cancer Sister   . Breast cancer Maternal Aunt   . Lung cancer Maternal Grandmother   . Pancreatic cancer Maternal Uncle   . Breast cancer Maternal Aunt   . Lung cancer Maternal Uncle   . Breast cancer Cousin   . Breast cancer Cousin     Vitals:  Blood pressure 107/63, pulse 99, temperature 98.1 F (36.7 C), temperature source Oral, resp. rate 18, height 5' 3"  (1.6 m), weight 158 lb 1 oz (71.7 kg), SpO2 99 %.  Physical Exam:  General: Well developed, well nourished female in no acute distress. Alert and oriented x 3.  Cardiovascular: Regular rate and rhythm. S1 and S2 normal.  Lungs: Clear to auscultation bilaterally. No wheezes/crackles/rhonchi noted.  Skin: No rashes or lesions present. Back: No CVA tenderness.  Abdomen: Abdomen soft, non-tender and obese. Active bowel sounds in all quadrants. No evidence of a fluid wave or abdominal masses. Lap sites the the abdomen healing with dermabond still present on three incisions.  No erythema or drainage.  Genitourinary:    Vulva/vagina: Normal external female genitalia. No lesions. White discharge noted on vulva and at introitus.   Urethra: No lesions or masses    Vagina: Vaginal cuff intact and healing well. Mildly atrophic without any lesions. No palpable masses. White discharge noted in the vagina.  Extremities: No bilateral cyanosis or clubbing. Mild bilateral pedal edema noted.   Assessment/Plan: 62 year old female s/p  robotic assisted total hysterectomy, bilateral salpingo-oophorectomy, sentinel lymph node biopsy on 11/04/17 for complex atypical hyperplasia.  She is doing well post-operatively.  Final pathology discussed with no further intervention needed.  Dr. Denman George spoke with the patient as well at the end of the visit.  Advised she could start taking a bath if she would like but she needed to hold off on intercourse for the full 8 weeks to allow for full healing of the vaginal cuff.  Reportable signs and symptoms reviewed.  Diflucan 150 mg one time dose sent to her pharmacy along with miconazole external cream for itching on the vulva.  Advised to continue monitoring her blood glucose levels.  Discussed options for constipation including miralax or colace. Our office will reach out to her PCP or arrange for her to meet with a gastroenterologist for a screening colonoscopy in the future.  All questions answered.  Advised to call for any needs or concerns.  She can follow up on an as needed basis.   Dorothyann Gibbs, NP 11/19/2017, 3:41 PM

## 2017-11-19 NOTE — Patient Instructions (Signed)
You can take a bath now.  Still no intercourse or anything in the vagina for until you are 8 weeks from surgery (after September 12). You can take Miralax once daily or stool softeners twice daily for your bowels.  Please call for any questions or concerns.  We have prescribed you dilfucan to take once and external yeast cream for your symptoms of itching.  Please call if your symptoms persist.

## 2017-12-02 ENCOUNTER — Other Ambulatory Visit: Payer: Self-pay | Admitting: Internal Medicine

## 2017-12-02 DIAGNOSIS — Z1231 Encounter for screening mammogram for malignant neoplasm of breast: Secondary | ICD-10-CM

## 2018-01-06 ENCOUNTER — Ambulatory Visit: Payer: Medicare Other

## 2018-02-01 ENCOUNTER — Other Ambulatory Visit: Payer: Self-pay | Admitting: Specialist

## 2018-02-01 ENCOUNTER — Ambulatory Visit
Admission: RE | Admit: 2018-02-01 | Discharge: 2018-02-01 | Disposition: A | Discharge status: Home / Self Care | Payer: Medicare Other | Source: Ambulatory Visit | Attending: Internal Medicine | Admitting: Internal Medicine

## 2018-02-01 DIAGNOSIS — Z1231 Encounter for screening mammogram for malignant neoplasm of breast: Secondary | ICD-10-CM

## 2019-02-02 ENCOUNTER — Other Ambulatory Visit: Payer: Self-pay | Admitting: Specialist

## 2019-02-02 DIAGNOSIS — Z1231 Encounter for screening mammogram for malignant neoplasm of breast: Secondary | ICD-10-CM

## 2019-03-24 ENCOUNTER — Other Ambulatory Visit: Payer: Self-pay

## 2019-03-24 ENCOUNTER — Ambulatory Visit
Admission: RE | Admit: 2019-03-24 | Discharge: 2019-03-24 | Disposition: A | Discharge status: Home / Self Care | Payer: Medicare Other | Source: Ambulatory Visit | Attending: Specialist | Admitting: Specialist

## 2019-03-24 DIAGNOSIS — Z1231 Encounter for screening mammogram for malignant neoplasm of breast: Secondary | ICD-10-CM

## 2019-04-29 ENCOUNTER — Encounter (HOSPITAL_COMMUNITY): Payer: Self-pay | Admitting: *Deleted

## 2019-04-29 ENCOUNTER — Other Ambulatory Visit: Payer: Self-pay

## 2019-04-29 ENCOUNTER — Ambulatory Visit (HOSPITAL_COMMUNITY)
Admission: EM | Admit: 2019-04-29 | Discharge: 2019-04-29 | Disposition: A | Discharge status: Home / Self Care | Payer: Medicare Other | Attending: Emergency Medicine | Admitting: Emergency Medicine

## 2019-04-29 DIAGNOSIS — U071 COVID-19: Secondary | ICD-10-CM | POA: Diagnosis not present

## 2019-04-29 DIAGNOSIS — Z20822 Contact with and (suspected) exposure to covid-19: Secondary | ICD-10-CM

## 2019-04-29 DIAGNOSIS — B9789 Other viral agents as the cause of diseases classified elsewhere: Secondary | ICD-10-CM | POA: Diagnosis not present

## 2019-04-29 DIAGNOSIS — J019 Acute sinusitis, unspecified: Secondary | ICD-10-CM | POA: Diagnosis not present

## 2019-04-29 LAB — POC SARS CORONAVIRUS 2 AG -  ED: SARS Coronavirus 2 Ag: NEGATIVE

## 2019-04-29 LAB — POC SARS CORONAVIRUS 2 AG: SARS Coronavirus 2 Ag: NEGATIVE

## 2019-04-29 MED ORDER — DOXYCYCLINE HYCLATE 100 MG PO CAPS: 100.0000 mg | ORAL_CAPSULE | Freq: Two times a day (BID) | ORAL | 0 refills | Status: AC

## 2019-04-29 MED ORDER — FLUTICASONE PROPIONATE 50 MCG/ACT NA SUSP: 2.0000 | Freq: Every day | NASAL | 0 refills | Status: AC

## 2019-04-29 NOTE — ED Provider Notes (Addendum)
HPI  SUBJECTIVE:  Deborah Camacho is a 64 y.o. female who presents with "sinus" for the past week.  She reports nasal congestion, yellow rhinorrhea, sinus pain and pressure, body aches, sinus headache.  She reports postnasal drip, cough, loss of sense of smell and taste and fatigue starting yesterday.  No fevers, sore throat, shortness of breath, nausea, vomiting, diarrhea, abdominal pain.  No upper dental pain, facial swelling.  No known exposure to Covid.  No antibiotics in the past month.  No antipyretic in the past 4 to 6 hours.  She tried over-the-counter sinus medicine with some improvement in her symptoms.  No aggravating factors.  It is not associated with bending forward, lying down.  States that she is sleeping okay at night without waking up coughing.  She has a past medical history of diabetes, hypertension.  No history of pulmonary disease, smoking, coronary disease, chronic kidney disease, HIV, cancer, immunocompromise.  PMD: Harvie Junior, MD   Past Medical History:  Diagnosis Date  . Arthritis   . Arthritis    both legs  . Back pain   . Diabetes mellitus    TYPE 2  . Dyslipidemia   . Hand pain   . Hyperlipidemia   . Hypertension   . Leg pain   . Vitamin D deficiency     Past Surgical History:  Procedure Laterality Date  . ABDOMINAL HYSTERECTOMY    . CARPAL TUNNEL RELEASE Bilateral   . ROBOTIC ASSISTED TOTAL HYSTERECTOMY WITH BILATERAL SALPINGO OOPHERECTOMY Bilateral 11/04/2017   Procedure: XI ROBOTIC ASSISTED TOTAL HYSTERECTOMY WITH BILATERAL SALPINGO OOPHORECTOMY;  Surgeon: Everitt Amber, MD;  Location: [facility];  Service: Gynecology;  Laterality: Bilateral;  . SENTINEL NODE BIOPSY N/A 11/04/2017   Procedure: SENTINEL LYMPH NODE BIOPSY;  Surgeon: Everitt Amber, MD;  Location: [facility];  Service: Gynecology;  Laterality: N/A;  . TUBAL LIGATION  1978    Family History  Problem Relation Age of Onset  . Hypertension Other   . Cancer Other   . Pancreatic cancer Sister    . Breast cancer Maternal Aunt   . Lung cancer Maternal Grandmother   . Pancreatic cancer Maternal Uncle   . Breast cancer Maternal Aunt   . Lung cancer Maternal Uncle   . Breast cancer Cousin   . Breast cancer Cousin     Social History   Tobacco Use  . Smoking status: Never Smoker  . Smokeless tobacco: Never Used  Substance Use Topics  . Alcohol use: No  . Drug use: Never    No current facility-administered medications for this encounter.  Current Outpatient Medications:  .  glimepiride (AMARYL) 4 MG tablet, Take 4 mg by mouth at bedtime. , Disp: , Rfl: 3 .  INVOKANA 100 MG TABS tablet, Take 100 mg by mouth daily. , Disp: , Rfl: 3 .  lisinopril-hydrochlorothiazide (PRINZIDE,ZESTORETIC) 20-25 MG tablet, Take 1 tablet by mouth daily. , Disp: , Rfl:  .  miconazole (MICOTIN) 2 % cream, Apply 1 application topically 2 (two) times daily. To vulva for itching, Disp: 28.35 g, Rfl: 0 .  Multiple Vitamin (MULTIVITAMIN WITH MINERALS) TABS tablet, Take 1 tablet by mouth daily., Disp: , Rfl:  .  oxyCODONE-acetaminophen (PERCOCET) 10-325 MG tablet, Take 1 tablet by mouth every 4 (four) hours as needed for pain., Disp: 20 tablet, Rfl: 0 .  TRULICITY 5.64 PP/2.9JJ SOPN, Inject 0.75 mg as directed every Wednesday., Disp: , Rfl: 5 .  doxycycline (VIBRAMYCIN) 100 MG capsule, Take 1 capsule (100  mg total) by mouth 2 (two) times daily for 7 days., Disp: 14 capsule, Rfl: 0 .  fluticasone (FLONASE) 50 MCG/ACT nasal spray, Place 2 sprays into both nostrils daily., Disp: 16 g, Rfl: 0 .  NARCAN 4 MG/0.1ML LIQD nasal spray kit, CALL 911. ADMINISTER A SINGLE SPRAY OF NARCAN IN ONE NOSTRIL. REPEAT EVERY 3 MINUTES AS NEEDED IF NO OR MINIMAL RESPONSE., Disp: , Rfl: 0  No Known Allergies   ROS  As noted in HPI.   Physical Exam  BP 139/79   Pulse 100   Temp 99 F (37.2 C) (Oral)   Resp 20   SpO2 99%   Constitutional: Well developed, well nourished, no acute distress Eyes:  EOMI, conjunctiva normal  bilaterally HENT: Normocephalic, atraumatic,mucus membranes moist.  No frontal, maxillary sinus tenderness.  No nasal congestion.  Normal turbinates.  No postnasal drip. Respiratory: Normal inspiratory effort lungs clear bilaterally good air movement Cardiovascular: Normal rate regular rhythm no murmurs rubs or gallops GI: nondistended skin: No rash, skin intact Musculoskeletal: no deformities Neurologic: Alert & oriented x 3, no focal neuro deficits Psychiatric: Speech and behavior appropriate   ED Course   Medications - No data to display  Orders Placed This Encounter  Procedures  . Novel Coronavirus, NAA (Hosp order, Send-out to Ref Lab; TAT 18-24 hrs    Standing Status:   Standing    Number of Occurrences:   1    Order Specific Question:   Is this test for diagnosis or screening    Answer:   Diagnosis of ill patient    Order Specific Question:   Symptomatic for COVID-19 as defined by CDC    Answer:   Yes    Order Specific Question:   Date of Symptom Onset    Answer:   04/22/2019    Order Specific Question:   Hospitalized for COVID-19    Answer:   No    Order Specific Question:   Admitted to ICU for COVID-19    Answer:   No    Order Specific Question:   Previously tested for COVID-19    Answer:   No    Order Specific Question:   Resident in a congregate (group) care setting    Answer:   No    Order Specific Question:   Employed in healthcare setting    Answer:   No    Order Specific Question:   Pregnant    Answer:   No  . POC SARS Coronavirus 2 Ag-ED - Nasal Swab (BD Veritor Kit)    Standing Status:   Standing    Number of Occurrences:   1    Order Specific Question:   Is this test for diagnosis or screening    Answer:   Diagnosis of ill patient    Order Specific Question:   Symptomatic for COVID-19 as defined by CDC    Answer:   Yes    Order Specific Question:   Date of Symptom Onset    Answer:   04/22/2019    Order Specific Question:   Hospitalized for COVID-19     Answer:   No    Order Specific Question:   Admitted to ICU for COVID-19    Answer:   No    Order Specific Question:   Previously tested for COVID-19    Answer:   No    Order Specific Question:   Resident in a congregate (group) care setting    Answer:   No  Order Specific Question:   Employed in healthcare setting    Answer:   No    Order Specific Question:   Pregnant    Answer:   No  . POC SARS Coronavirus 2 Ag    Standing Status:   Standing    Number of Occurrences:   1    Results for orders placed or performed during the hospital encounter of 04/29/19 (from the past 24 hour(s))  POC SARS Coronavirus 2 Ag-ED - Nasal Swab (BD Veritor Kit)     Status: None   Collection Time: 04/29/19 12:36 PM  Result Value Ref Range   SARS Coronavirus 2 Ag NEGATIVE NEGATIVE  POC SARS Coronavirus 2 Ag     Status: None   Collection Time: 04/29/19 12:36 PM  Result Value Ref Range   SARS Coronavirus 2 Ag NEGATIVE NEGATIVE  Novel Coronavirus, NAA (Hosp order, Send-out to Ref Lab; TAT 18-24 hrs     Status: Abnormal   Collection Time: 04/29/19  6:57 PM   Specimen: Nasopharyngeal Swab; Respiratory  Result Value Ref Range   SARS-CoV-2, NAA DETECTED (A) NOT DETECTED   Coronavirus Source NASOPHARYNGEAL    No results found.  ED Clinical Impression  1. Suspected COVID-19 virus infection   2. Acute non-recurrent sinusitis, unspecified location      ED Assessment/Plan  Suspect Covid infection.  Home with saline nasal irrigation, Flonase, Tylenol/ibuprofen as needed for pain, regular Mucinex.  Covid PCR sent.  Wait-and-see prescription of doxycycline if she starts having fevers, or does not improve in 10 to 14 days of being sick total for presumed secondary bacterial sinusitis.  Covid PCR positive.  Called patient and discussed results with her.  Discussed CDC isolation criteria with her.  Discussed  MDM, treatment plan, and plan for follow-up with patient. Discussed sn/sx that should prompt return to  the ED. patient agrees with plan.   Meds ordered this encounter  Medications  . fluticasone (FLONASE) 50 MCG/ACT nasal spray    Sig: Place 2 sprays into both nostrils daily.    Dispense:  16 g    Refill:  0  . doxycycline (VIBRAMYCIN) 100 MG capsule    Sig: Take 1 capsule (100 mg total) by mouth 2 (two) times daily for 7 days.    Dispense:  14 capsule    Refill:  0    *This clinic note was created using [employer]. Therefore, there may be occasional mistakes despite careful proofreading.   ?    Melynda Ripple, MD 04/30/19 [id number]    Melynda Ripple, MD 04/30/19 [id number]

## 2019-04-29 NOTE — ED Triage Notes (Signed)
C/O starting with sinus pressure and nasal congestion, and loss of smell & taste.  Started with cough and weakness over past 2 days.  Denies any known fevers.  Denies vomiting or diarrhea.

## 2019-04-29 NOTE — Discharge Instructions (Addendum)
I suspect that this is a Covid infection.  In any case, you do not have any clear indications for antibiotics at this point in time.  Start Mucinex to keep the mucous thin. Return to the ER if you get worse, have a fever >100.4, difficulty breathing, oxygen saturation below 90%, or for any concerns. You may take 400 mg of motrin with 1 gram of tylenol up to 3-4 times a day as needed for pain. This is an effective combination for pain.  Most sinus infections are viral and do not need antibiotics unless you have a high fever, have had this for 10-14days, or you get better and then get sick again. Use a NeilMed sinus rinse as often as you want to to reduce nasal congestion. Follow the directions on the box.   Go to www.goodrx.com to look up your medications. This will give you a list of where you can find your prescriptions at the most affordable prices. Or you can ask the pharmacist what the cash price is. This is frequently cheaper than going through insurance.

## 2019-04-30 ENCOUNTER — Telehealth (HOSPITAL_COMMUNITY): Payer: Self-pay | Admitting: Emergency Medicine

## 2019-04-30 LAB — NOVEL CORONAVIRUS, NAA (HOSP ORDER, SEND-OUT TO REF LAB; TAT 18-24 HRS): SARS-CoV-2, NAA: DETECTED — AB

## 2019-05-01 ENCOUNTER — Telehealth: Payer: Self-pay | Admitting: Nurse Practitioner

## 2019-05-01 NOTE — Telephone Encounter (Signed)
Called to Discuss with patient about Covid symptoms and the use of bamlanivimab, a monoclonal antibody infusion for those with mild to moderate Covid symptoms and at a high risk of hospitalization.     Pt is qualified for this infusion at the [facility] due to co-morbid conditions and/or a member of an at-risk group.     Unable to reach pt  

## 2019-05-02 ENCOUNTER — Telehealth (HOSPITAL_COMMUNITY): Payer: Self-pay | Admitting: Emergency Medicine

## 2019-05-02 NOTE — Telephone Encounter (Signed)

## 2019-08-09 NOTE — Telephone Encounter (Signed)
This telephone note was created in error

## 2020-02-12 ENCOUNTER — Other Ambulatory Visit: Payer: Self-pay | Admitting: Specialist

## 2020-02-12 DIAGNOSIS — Z1231 Encounter for screening mammogram for malignant neoplasm of breast: Secondary | ICD-10-CM

## 2020-03-25 ENCOUNTER — Ambulatory Visit
Admission: RE | Admit: 2020-03-25 | Discharge: 2020-03-25 | Disposition: A | Discharge status: Home / Self Care | Payer: Medicare Other | Source: Ambulatory Visit | Attending: Specialist | Admitting: Specialist

## 2020-03-25 ENCOUNTER — Other Ambulatory Visit: Payer: Self-pay

## 2020-03-25 DIAGNOSIS — Z1231 Encounter for screening mammogram for malignant neoplasm of breast: Secondary | ICD-10-CM

## 2020-03-27 ENCOUNTER — Other Ambulatory Visit: Payer: Self-pay | Admitting: Specialist

## 2020-03-27 DIAGNOSIS — R928 Other abnormal and inconclusive findings on diagnostic imaging of breast: Secondary | ICD-10-CM

## 2020-03-28 ENCOUNTER — Ambulatory Visit (INDEPENDENT_AMBULATORY_CARE_PROVIDER_SITE_OTHER): Payer: Medicare Other | Admitting: Podiatry

## 2020-03-28 ENCOUNTER — Other Ambulatory Visit: Payer: Self-pay | Admitting: Podiatry

## 2020-03-28 ENCOUNTER — Encounter: Payer: Self-pay | Admitting: Podiatry

## 2020-03-28 ENCOUNTER — Ambulatory Visit (INDEPENDENT_AMBULATORY_CARE_PROVIDER_SITE_OTHER): Payer: Medicare Other

## 2020-03-28 ENCOUNTER — Other Ambulatory Visit: Payer: Self-pay

## 2020-03-28 DIAGNOSIS — M779 Enthesopathy, unspecified: Secondary | ICD-10-CM | POA: Diagnosis not present

## 2020-03-28 DIAGNOSIS — M7751 Other enthesopathy of right foot: Secondary | ICD-10-CM

## 2020-03-28 DIAGNOSIS — M79671 Pain in right foot: Secondary | ICD-10-CM | POA: Diagnosis not present

## 2020-03-28 DIAGNOSIS — G629 Polyneuropathy, unspecified: Secondary | ICD-10-CM | POA: Diagnosis not present

## 2020-03-28 DIAGNOSIS — M722 Plantar fascial fibromatosis: Secondary | ICD-10-CM | POA: Diagnosis not present

## 2020-03-28 DIAGNOSIS — M7752 Other enthesopathy of left foot: Secondary | ICD-10-CM | POA: Diagnosis not present

## 2020-03-28 DIAGNOSIS — M79672 Pain in left foot: Secondary | ICD-10-CM

## 2020-03-28 MED ORDER — TRIAMCINOLONE ACETONIDE 10 MG/ML IJ SUSP
10.0000 mg | Freq: Once | INTRAMUSCULAR | Status: AC
  Administered 2020-03-28: 10 mg

## 2020-03-28 NOTE — Progress Notes (Signed)
Subjective:   Patient ID: Deborah Camacho, female   DOB: 64 y.o.   MRN: [id number]   HPI Patient presents stating I am getting numbness in my forefeet bilateral and I am getting pain in my right ankle that is really been bothering me.  States the ankle is what sore the other is numbing with occasional tingling pain and patient does have long-term diabetes under reasonable control.  Patient does not smoke I would like to be more active   Review of Systems  All other systems reviewed and are negative.       Objective:  Physical Exam Vitals and nursing note reviewed.  Constitutional:      Appearance: She is well-developed and well-nourished.  Cardiovascular:     Pulses: Intact distal pulses.  Pulmonary:     Effort: Pulmonary effort is normal.  Musculoskeletal:        General: Normal range of motion.  Skin:    General: Skin is warm.  Neurological:     Mental Status: She is alert.     Neurovascular status intact muscle strength found to be adequate range of motion is within normal limits.  Patient is found to have exquisite discomfort sinus tarsi right with inflammation fluid noted within the sinus tarsi and forefoot has mild diminishment of sharp dull vibratory localized with no other pathology noted.  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  I then discussed forefoot do not recommend treatment but did discuss oral medications we could try that she can utilize topical medicines currently.  Reappoint to recheck if  symptoms persist or if anything else gets worse  X-rays indicate that there is moderate arthritis no indication stress fracture or advanced arthritis

## 2020-03-29 ENCOUNTER — Ambulatory Visit: Payer: Medicare Other

## 2020-04-04 ENCOUNTER — Other Ambulatory Visit: Payer: Self-pay | Admitting: Family Medicine

## 2020-04-05 ENCOUNTER — Ambulatory Visit: Payer: Medicare Other

## 2020-04-05 ENCOUNTER — Other Ambulatory Visit: Payer: Self-pay

## 2020-04-05 ENCOUNTER — Ambulatory Visit
Admission: RE | Admit: 2020-04-05 | Discharge: 2020-04-05 | Disposition: A | Discharge status: Home / Self Care | Payer: Medicare Other | Source: Ambulatory Visit | Attending: Specialist | Admitting: Specialist

## 2020-04-05 DIAGNOSIS — R928 Other abnormal and inconclusive findings on diagnostic imaging of breast: Secondary | ICD-10-CM

## 2020-05-17 ENCOUNTER — Ambulatory Visit: Payer: Medicare Other

## 2020-05-21 ENCOUNTER — Ambulatory Visit: Payer: Medicare Other | Attending: Internal Medicine

## 2020-05-21 DIAGNOSIS — Z23 Encounter for immunization: Secondary | ICD-10-CM

## 2020-05-21 NOTE — Progress Notes (Signed)
   Covid-19 Vaccination Clinic  Name:  Deborah Camacho    MRN: [id number] DOB: 1955/07/19  05/21/2020  [name] was observed post Covid-19 immunization for 15 minutes without incident. She was provided with Vaccine Information Sheet and instruction to access the V-Safe system.   [name] was instructed to call 911 with any severe reactions post vaccine: Marland Kitchen Difficulty breathing  . Swelling of face and throat  . A fast heartbeat  . A bad rash all over body  . Dizziness and weakness   Immunizations Administered    Name Date Dose VIS Date Route   PFIZER Comrnaty(Gray TOP) Covid-19 Vaccine 05/21/2020  2:16 PM 0.3 mL 03/28/2020 Intramuscular   Manufacturer: [employer]   Lot: [id number]   NDC: [phone]

## 2021-02-24 ENCOUNTER — Other Ambulatory Visit: Payer: Self-pay | Admitting: Specialist

## 2021-02-24 DIAGNOSIS — Z1231 Encounter for screening mammogram for malignant neoplasm of breast: Secondary | ICD-10-CM

## 2021-02-26 ENCOUNTER — Other Ambulatory Visit: Payer: Self-pay | Admitting: Nephrology

## 2021-02-26 DIAGNOSIS — N1831 Chronic kidney disease, stage 3a: Secondary | ICD-10-CM

## 2021-03-05 ENCOUNTER — Ambulatory Visit (INDEPENDENT_AMBULATORY_CARE_PROVIDER_SITE_OTHER): Payer: Medicare Other | Admitting: Podiatry

## 2021-03-05 ENCOUNTER — Encounter: Payer: Self-pay | Admitting: Podiatry

## 2021-03-05 ENCOUNTER — Other Ambulatory Visit: Payer: Self-pay

## 2021-03-05 DIAGNOSIS — M7751 Other enthesopathy of right foot: Secondary | ICD-10-CM

## 2021-03-05 DIAGNOSIS — E1140 Type 2 diabetes mellitus with diabetic neuropathy, unspecified: Secondary | ICD-10-CM

## 2021-03-05 DIAGNOSIS — E1149 Type 2 diabetes mellitus with other diabetic neurological complication: Secondary | ICD-10-CM | POA: Diagnosis not present

## 2021-03-05 NOTE — Progress Notes (Signed)
Subjective:   Patient ID: Deborah Camacho, female   DOB: 65 y.o.   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

## 2021-03-17 ENCOUNTER — Other Ambulatory Visit: Payer: Medicare Other

## 2021-03-28 ENCOUNTER — Ambulatory Visit
Admission: RE | Admit: 2021-03-28 | Discharge: 2021-03-28 | Disposition: A | Discharge status: Home / Self Care | Payer: Medicare Other | Source: Ambulatory Visit | Attending: Specialist | Admitting: Specialist

## 2021-03-28 DIAGNOSIS — Z1231 Encounter for screening mammogram for malignant neoplasm of breast: Secondary | ICD-10-CM

## 2021-03-31 ENCOUNTER — Ambulatory Visit
Admission: RE | Admit: 2021-03-31 | Discharge: 2021-03-31 | Disposition: A | Discharge status: Home / Self Care | Payer: Medicare Other | Source: Ambulatory Visit | Attending: Nephrology | Admitting: Nephrology

## 2021-03-31 DIAGNOSIS — N1831 Chronic kidney disease, stage 3a: Secondary | ICD-10-CM

## 2021-06-06 ENCOUNTER — Encounter: Payer: Self-pay | Admitting: Gastroenterology

## 2021-06-25 ENCOUNTER — Encounter: Payer: Self-pay | Admitting: Gastroenterology

## 2021-06-25 ENCOUNTER — Ambulatory Visit (INDEPENDENT_AMBULATORY_CARE_PROVIDER_SITE_OTHER): Payer: Medicare Other | Admitting: Gastroenterology

## 2021-06-25 VITALS — BP 116/68 | HR 88 | Ht 63.0 in | Wt 162.0 lb

## 2021-06-25 DIAGNOSIS — R634 Abnormal weight loss: Secondary | ICD-10-CM

## 2021-06-25 DIAGNOSIS — Z1211 Encounter for screening for malignant neoplasm of colon: Secondary | ICD-10-CM

## 2021-06-25 DIAGNOSIS — R12 Heartburn: Secondary | ICD-10-CM

## 2021-06-25 DIAGNOSIS — K5909 Other constipation: Secondary | ICD-10-CM | POA: Diagnosis not present

## 2021-06-25 DIAGNOSIS — R1013 Epigastric pain: Secondary | ICD-10-CM

## 2021-06-25 MED ORDER — GOLYTELY 236 G PO SOLR: 4000.0000 mL | Freq: Once | ORAL | 0 refills | Status: AC

## 2021-06-25 NOTE — Patient Instructions (Addendum)
If you are age 68 or older, your body mass index should be between 23-30. Your Body mass index is 28.7 kg/m?Marland Kitchen If this is out of the aforementioned range listed, please consider follow up with your Primary Care Provider. ? ?If you are age 33 or younger, your body mass index should be between 19-25. Your Body mass index is 28.7 kg/m?Marland Kitchen If this is out of the aformentioned range listed, please consider follow up with your Primary Care Provider.  ? ?________________________________________________________ ? ?The [facility] providers would like to encourage you to use [facility] to communicate with providers for non-urgent requests or questions.  Due to long hold times on the telephone, sending your provider a message by [facility] may be a faster and more efficient way to get a response.  Please allow 48 business hours for a response.  Please remember that this is for non-urgent requests.  ?_______________________________________________________ ? ?You have been scheduled for an endoscopy and colonoscopy. Please follow the written instructions given to you at your visit today. ?Please pick up your prep supplies at the pharmacy within the next 1-3 days. ?If you use inhalers (even only as needed), please bring them with you on the day of your procedure. ? ?Your provider has requested that you go to the basement level for lab work before leaving today. Press "B" on the elevator. The lab is located at the first door on the left as you exit the elevator.  ?Due to recent changes in healthcare laws, you may see the results of your imaging and laboratory studies on MyChart before your provider has had a chance to review them.  We understand that in some cases there may be results that are confusing or concerning to you. Not all laboratory results come back in the same time frame and the provider may be waiting for multiple results in order to interpret others.  Please give [place] 48 hours in order for your provider to thoroughly review all  the results before contacting the office for clarification of your results.   ? ?Start Miralax daily.  ? ? ?It was a pleasure to see you today! ? ?Thank you for trusting me with your gastrointestinal care!   ?  ?

## 2021-06-25 NOTE — Progress Notes (Signed)
[facility] Consult Note:  History: Deborah Camacho 06/25/2021  Referring provider: Harvie Junior, MD  Reason for consult/chief complaint: Weight Loss (Has gained some weight, appetite  has come back )   Subjective  HPI: Deborah Camacho was referred by her primary care provider for a screening colonoscopy, then brought for an office visit when she communicated concerns of weight loss to our office staff at the time of phone contact. Referral from primary care 05/30/21 includes a brief office note, no data, no mention of weight loss and referral for weight loss and screening colonoscopy.  [name] had about a 10 pound weight loss that prompted her recent PCP visit.  She recalls no further testing for that such as labs or imaging.  She was put on trazodone to help with her sleep because she was also struggling with depression, and says that medicine has been very helpful.  Her sleep is improved, her mood improved and her weight has gone back up to 8 pounds.  She has years of heartburn a few times a week for which she was on omeprazole daily but stopped it because she did not always feel she needed it.  Intermittent postprandial dyspepsia that is somewhat difficult to characterize.  She also has chronic complaints of throat mucus and sinus congestion with postnasal drip.  No change in vocal quality. She denies chest pain or heartburn coming on with exertion. Many years of chronic constipation with a BM about twice a week, gets plenty of fruits and vegetables, admits she can drink some more water and get more activity.  She has not been taking any particular therapy to relieve constipation. No prior colorectal cancer screening, no known family history of colon esophagus or stomach cancer.   ROS:  Review of Systems  Constitutional:  Negative for appetite change and unexpected weight change.  HENT:  Negative for mouth sores and voice change.   Eyes:  Negative for pain and  redness.  Respiratory:  Negative for cough and shortness of breath.   Cardiovascular:  Negative for chest pain and palpitations.  Genitourinary:  Negative for dysuria and hematuria.  Musculoskeletal:  Positive for arthralgias. Negative for myalgias.  Skin:  Negative for pallor and rash.  Neurological:  Negative for weakness and headaches.  Hematological:  Negative for adenopathy.  Psychiatric/Behavioral:         Depression, lately improved    Past Medical History: Past Medical History:  Diagnosis Date   Arthritis    Arthritis    both legs   Back pain    Diabetes mellitus    TYPE 2   Dyslipidemia    Hand pain    Hyperlipidemia    Hypertension    Leg pain    Vitamin D deficiency      Past Surgical History: Past Surgical History:  Procedure Laterality Date   ABDOMINAL HYSTERECTOMY     CARPAL TUNNEL RELEASE Bilateral    ROBOTIC ASSISTED TOTAL HYSTERECTOMY WITH BILATERAL SALPINGO OOPHERECTOMY Bilateral 11/04/2017   Procedure: XI ROBOTIC ASSISTED TOTAL HYSTERECTOMY WITH BILATERAL SALPINGO OOPHORECTOMY;  Surgeon: Everitt Amber, MD;  Location: [facility];  Service: Gynecology;  Laterality: Bilateral;   SENTINEL NODE BIOPSY N/A 11/04/2017   Procedure: SENTINEL LYMPH NODE BIOPSY;  Surgeon: Everitt Amber, MD;  Location: [facility];  Service: Gynecology;  Laterality: N/A;   TUBAL LIGATION  1978     Family History: Family History  Problem Relation Age of Onset   Pancreatic cancer Sister    Lung  cancer Maternal Grandmother    Breast cancer Maternal Aunt    Breast cancer Maternal Aunt    Pancreatic cancer Maternal Uncle    Lung cancer Maternal Uncle    Breast cancer Cousin    Breast cancer Cousin    Hypertension Other    Cancer Other    Colon cancer Neg Hx    Stomach cancer Neg Hx    Esophageal cancer Neg Hx     Social History: Social History   Socioeconomic History   Marital status: Legally Separated    Spouse name: Not on file   Number of children: Not on file   Years of  education: Not on file   Highest education level: Not on file  Occupational History   Not on file  Tobacco Use   Smoking status: Never   Smokeless tobacco: Never  Vaping Use   Vaping Use: Never used  Substance and Sexual Activity   Alcohol use: No   Drug use: Never   Sexual activity: Not on file  Other Topics Concern   Not on file  Social History Narrative   Social History      Diet?        Do you drink/eat things with caffeine? yes      Marital status?                                    What year were you married?      Do you live in a house, apartment, assisted living, condo, trailer, etc.? apartment      Is it one or more stories?      How many persons live in your home? me      Do you have any pets in your home? (please list) one dog      Highest level of education completed?      Current or past profession:      Do you exercise?                 no                     Type & how often?      Advanced Directives      Do you have a living will? no      Do you have a DNR form?           no                       If not, do you want to discuss one?      Do you have signed POA/HPOA for forms?  no      Functional Status      Do you have difficulty bathing or dressing yourself?      Do you have difficulty preparing food or eating?       Do you have difficulty managing your medications?      Do you have difficulty managing your finances?      Do you have difficulty affording your medications?   Social Determinants of Health   Financial Resource Strain: Not on file  Food Insecurity: Not on file  Transportation Needs: Not on file  Physical Activity: Not on file  Stress: Not on file  Social Connections: Not on file    Allergies: No Known Allergies  Outpatient Meds: Current Outpatient Medications  Medication Sig Dispense Refill   atorvastatin (LIPITOR) 40 MG tablet atorvastatin 40 mg tablet     fluticasone (FLONASE) 50 MCG/ACT nasal spray Place 2  sprays into both nostrils daily. 16 g 0   glimepiride (AMARYL) 4 MG tablet Take 4 mg by mouth at bedtime.   3   INVOKANA 100 MG TABS tablet Take 100 mg by mouth daily.   3   JARDIANCE 25 MG TABS tablet Take 25 mg by mouth daily.     lisinopril-hydrochlorothiazide (PRINZIDE,ZESTORETIC) 20-25 MG tablet Take 1 tablet by mouth daily.      Multiple Vitamin (MULTIVITAMIN WITH MINERALS) TABS tablet Take 1 tablet by mouth daily.     NARCAN 4 MG/0.1ML LIQD nasal spray kit CALL 911. ADMINISTER A SINGLE SPRAY OF NARCAN IN ONE NOSTRIL. REPEAT EVERY 3 MINUTES AS NEEDED IF NO OR MINIMAL RESPONSE.  0   omeprazole (PRILOSEC) 20 MG capsule Take 20 mg by mouth every morning.     oxyCODONE-acetaminophen (PERCOCET) 10-325 MG tablet Take 1 tablet by mouth every 4 (four) hours as needed for pain. 20 tablet 0   traZODone (DESYREL) 50 MG tablet Take 50 mg by mouth at bedtime as needed.     TRULICITY 2.54 DI/2.6EB SOPN Inject 0.75 mg as directed every Wednesday.  5   No current facility-administered medications for this visit.      ___________________________________________________________________ Objective   Exam:  BP 116/68    Pulse 88    Ht 5' 3"  (1.6 m)    Wt 162 lb (73.5 kg)    SpO2 98%    BMI 28.70 kg/m  Wt Readings from Last 3 Encounters:  06/25/21 162 lb (73.5 kg)  11/19/17 158 lb 1 oz (71.7 kg)  11/04/17 158 lb (71.7 kg)    General: Well-appearing, good muscle mass. Eyes: sclera anicteric, no redness ENT: oral mucosa moist without lesions, no cervical or supraclavicular lymphadenopathy CV: RRR without murmur, S1/S2, no JVD, no peripheral edema Resp: clear to auscultation bilaterally, normal RR and effort noted GI: soft, overweight, no tenderness, with active bowel sounds.  No appreciable mass or hepatosplenomegaly, though somewhat limited by body habitus. Skin; warm and dry, no rash or jaundice noted Neuro: awake, alert and oriented x 3. Normal gross motor function and fluent  speech  Labs: No recent data for review  Assessment: Encounter Diagnoses  Name Primary?   Heartburn Yes   Dyspepsia    Weight loss    Chronic constipation    Special screening for malignant neoplasms, colon     Weight loss that has reportedly improved, therefore not clearly of a digestive cause.  No recent lab testing in a patient with chronic medical conditions.  Chronic heartburn and dyspepsia, no longer on regular dose of acid suppression.  Possible GERD, H. pylori or both.  Longstanding constipation  Average risk colorectal cancer  Plan: Screening colonoscopy Diagnostic upper endoscopy for heartburn and dyspepsia. She was agreeable after discussion of both procedures and risks.  The benefits and risks of the planned procedure were described in detail with the patient or (when appropriate) their health care proxy.  Risks were outlined as including, but not limited to, bleeding, infection, perforation, adverse medication reaction leading to cardiac or pulmonary decompensation, pancreatitis (if ERCP).  The limitation of incomplete mucosal visualization was also discussed.  No guarantees or warranties were given.  Recommended she start daily MiraLAX, dose adjust as needed  GERD diet and lifestyle measures recommended  Labs today: CBC, CMP, TSH with free  T4, hemoglobin A1c  Thank you for the courtesy of this consult.  Please call me with any questions or concerns.  Nelida Meuse III  CC: Referring provider noted above

## 2021-08-05 ENCOUNTER — Telehealth: Payer: Self-pay | Admitting: Gastroenterology

## 2021-08-05 NOTE — Telephone Encounter (Signed)
Patient called to reschedule procedures scheduled for tomorrow to 09/08/21. Per patient, just read her prep instructions and did not prepare correctly.  ?

## 2021-08-06 ENCOUNTER — Encounter: Payer: Medicare Other | Admitting: Gastroenterology

## 2021-09-08 ENCOUNTER — Ambulatory Visit (AMBULATORY_SURGERY_CENTER): Payer: Medicare Other | Admitting: Gastroenterology

## 2021-09-08 ENCOUNTER — Encounter: Payer: Self-pay | Admitting: Gastroenterology

## 2021-09-08 VITALS — BP 117/73 | HR 80 | Temp 98.7°F | Resp 19 | Ht 63.0 in | Wt 162.0 lb

## 2021-09-08 DIAGNOSIS — D124 Benign neoplasm of descending colon: Secondary | ICD-10-CM | POA: Diagnosis not present

## 2021-09-08 DIAGNOSIS — D123 Benign neoplasm of transverse colon: Secondary | ICD-10-CM | POA: Diagnosis not present

## 2021-09-08 DIAGNOSIS — R12 Heartburn: Secondary | ICD-10-CM

## 2021-09-08 DIAGNOSIS — K5900 Constipation, unspecified: Secondary | ICD-10-CM

## 2021-09-08 DIAGNOSIS — R634 Abnormal weight loss: Secondary | ICD-10-CM

## 2021-09-08 DIAGNOSIS — Z1211 Encounter for screening for malignant neoplasm of colon: Secondary | ICD-10-CM

## 2021-09-08 DIAGNOSIS — D120 Benign neoplasm of cecum: Secondary | ICD-10-CM

## 2021-09-08 MED ORDER — SODIUM CHLORIDE 0.9 % IV SOLN: 500.0000 mL | Freq: Once | INTRAVENOUS | Status: DC

## 2021-09-08 NOTE — Op Note (Addendum)
[place] Patient Name: Deborah Camacho Procedure Date: 09/08/2021 2:54 PM MRN: [id number] Endoscopist: Mallie Mussel L. Loletha Carrow , MD Age: 66 Referring MD:  Date of Birth: [birth year] Gender: Female Account #: [id number] Procedure:                Colonoscopy Indications:              Constipation, Weight loss Medicines:                Monitored Anesthesia Care Procedure:                Pre-Anesthesia Assessment:                           - Prior to the procedure, a History and Physical                            was performed, and patient medications and                            allergies were reviewed. The patient's tolerance of                            previous anesthesia was also reviewed. The risks                            and benefits of the procedure and the sedation                            options and risks were discussed with the patient.                            All questions were answered, and informed consent                            was obtained. Prior Anticoagulants: The patient has                            taken no previous anticoagulant or antiplatelet                            agents. ASA Grade Assessment: III - A patient with                            severe systemic disease. After reviewing the risks                            and benefits, the patient was deemed in                            satisfactory condition to undergo the procedure.                           After obtaining informed consent, the colonoscope  was passed under direct vision. Throughout the                            procedure, the patient's blood pressure, pulse, and                            oxygen saturations were monitored continuously. The                            CF [id number] #[id number] was introduced through the anus                            and advanced to the the cecum, identified by                            appendiceal orifice and ileocecal  valve. The                            colonoscopy was performed without difficulty. The                            patient tolerated the procedure well. The quality                            of the bowel preparation was fair (fibrous debris                            in lutiple colon segments that could not be                            completely cleared, interfering with                            visualization). The ileocecal valve, appendiceal                            orifice, and rectum were photographed. The bowel                            preparation used was GoLYTELY. Scope In: 3:23:16 PM Scope Out: 3:39:30 PM Scope Withdrawal Time: 0 hours 12 minutes 1 second  Total Procedure Duration: 0 hours 16 minutes 14 seconds  Findings:                 The perianal and digital rectal examinations were                            normal.                           A diminutive polyp was found in the cecum. The                            polyp was semi-sessile. The polyp was removed with  a piecemeal technique using a cold biopsy forceps.                            Resection and retrieval were complete.                           Two sessile polyps were found in the descending                            colon and transverse colon. The polyps were 4 mm in                            size. These polyps were removed with a cold snare.                            Resection and retrieval were complete.                           The sigmoid colon was redundant.                           The exam was otherwise without abnormality on                            direct and retroflexion views.                           Repeat examination of right colon under NBI                            performed. Complications:            No immediate complications. Estimated Blood Loss:     Estimated blood loss was minimal. Impression:               - Preparation of the colon was fair.                            - One diminutive polyp in the cecum, removed                            piecemeal using a cold biopsy forceps. Resected and                            retrieved.                           - Two 4 mm polyps in the descending colon and in                            the transverse colon, removed with a cold snare.                            Resected and retrieved.                           -  Redundant colon.                           - The examination was otherwise normal on direct                            and retroflexion views.                           No cause seen for weight loss, which does not                            appear to be of a GI cause. Recommendation:           - Patient has a contact number available for                            emergencies. The signs and symptoms of potential                            delayed complications were discussed with the                            patient. Return to normal activities tomorrow.                            Written discharge instructions were provided to the                            patient.                           - Resume previous diet.                           - Continue present medications.                           - Await pathology results.                           - See the other procedure note for documentation of                            additional recommendations.                           - Repeat colonoscopy in 1 year for surveillance                            (polyps, prep quality). Closer attention to                            pre-procedure dietary retrictions. additional                            laxative treatment for 2-3 days  prior to prep day ,                            and consume more water with prep for next exam. Mallie Mussel L. Loletha Carrow, MD 09/08/2021 3:53:38 PM This report has been signed electronically.

## 2021-09-08 NOTE — Progress Notes (Signed)
Called to room to assist during endoscopic procedure.  Patient ID and intended procedure confirmed with present staff. Received instructions for my participation in the procedure from the performing physician.  

## 2021-09-08 NOTE — Progress Notes (Unsigned)
History and Physical:  This patient presents for endoscopic testing for: Encounter Diagnoses  Name Primary?   Weight loss Yes   Special screening for malignant neoplasms, colon    Heartburn     Clinical details in my office consult note dated 06/25/2021. Her weight loss resolved after starting an antidepressant. Some lab studies were recommended at the office visit but it does not appear that she went to the lab to have them drawn.  Today she told me that she has been back to her PCP since her office visit with me. Intermittent heartburn without dysphagia or odynophagia, nausea or vomiting.  Average risk colorectal cancer screening.  Patient is otherwise without complaints or active issues today.   Past Medical History: Past Medical History:  Diagnosis Date   Arthritis    Arthritis    both legs   Back pain    Diabetes mellitus    TYPE 2   Dyslipidemia    Hand pain    Hyperlipidemia    Hypertension    Leg pain    Vitamin D deficiency      Past Surgical History: Past Surgical History:  Procedure Laterality Date   ABDOMINAL HYSTERECTOMY     CARPAL TUNNEL RELEASE Bilateral    ROBOTIC ASSISTED TOTAL HYSTERECTOMY WITH BILATERAL SALPINGO OOPHERECTOMY Bilateral 11/04/2017   Procedure: XI ROBOTIC ASSISTED TOTAL HYSTERECTOMY WITH BILATERAL SALPINGO OOPHORECTOMY;  Surgeon: Everitt Amber, MD;  Location: [facility];  Service: Gynecology;  Laterality: Bilateral;   SENTINEL NODE BIOPSY N/A 11/04/2017   Procedure: SENTINEL LYMPH NODE BIOPSY;  Surgeon: Everitt Amber, MD;  Location: [facility];  Service: Gynecology;  Laterality: N/A;   TUBAL LIGATION  1978    Allergies: No Known Allergies  Outpatient Meds: Current Outpatient Medications  Medication Sig Dispense Refill   atorvastatin (LIPITOR) 40 MG tablet atorvastatin 40 mg tablet     JARDIANCE 25 MG TABS tablet Take 25 mg by mouth daily.     lisinopril-hydrochlorothiazide (PRINZIDE,ZESTORETIC) 20-25 MG tablet Take 1 tablet by mouth daily.       oxyCODONE-acetaminophen (PERCOCET) 10-325 MG tablet Take 1 tablet by mouth every 4 (four) hours as needed for pain. 20 tablet 0   TRULICITY 5.00 XF/8.1WE SOPN Inject 0.75 mg as directed every Wednesday.  5   fluticasone (FLONASE) 50 MCG/ACT nasal spray Place 2 sprays into both nostrils daily. (Patient not taking: Reported on 09/08/2021) 16 g 0   gabapentin (NEURONTIN) 100 MG capsule Take 100 mg by mouth 2 (two) times daily.     glimepiride (AMARYL) 4 MG tablet Take 4 mg by mouth at bedtime.  (Patient not taking: Reported on 09/08/2021)  3   INVOKANA 100 MG TABS tablet Take 100 mg by mouth daily.  (Patient not taking: Reported on 09/08/2021)  3   Multiple Vitamin (MULTIVITAMIN WITH MINERALS) TABS tablet Take 1 tablet by mouth daily. (Patient not taking: Reported on 09/08/2021)     NARCAN 4 MG/0.1ML LIQD nasal spray kit CALL 911. ADMINISTER A SINGLE SPRAY OF NARCAN IN ONE NOSTRIL. REPEAT EVERY 3 MINUTES AS NEEDED IF NO OR MINIMAL RESPONSE. (Patient not taking: Reported on 09/08/2021)  0   omeprazole (PRILOSEC) 20 MG capsule Take 20 mg by mouth every morning. (Patient not taking: Reported on 09/08/2021)     traZODone (DESYREL) 50 MG tablet Take 50 mg by mouth at bedtime as needed. (Patient not taking: Reported on 09/08/2021)     Current Facility-Administered Medications  Medication Dose Route Frequency Provider Last Rate Last Admin  0.9 %  sodium chloride infusion  500 mL Intravenous Once Doran Stabler, MD          ___________________________________________________________________ Objective   Exam:  BP (!) 131/93   Pulse 93   Temp 98.7 F (37.1 C) (Skin)   Resp (!) 25   Ht 5' 3"  (1.6 m)   Wt 162 lb (73.5 kg)   SpO2 100%   BMI 28.70 kg/m   CV: RRR without murmur, S1/S2 Resp: clear to auscultation bilaterally, normal RR and effort noted GI: soft, no tenderness, with active bowel sounds.   Assessment: Encounter Diagnoses  Name Primary?   Weight loss Yes   Special screening for  malignant neoplasms, colon    Heartburn      Plan: Colonoscopy EGD  The benefits and risks of the planned procedure were described in detail with the patient or (when appropriate) their health care proxy.  Risks were outlined as including, but not limited to, bleeding, infection, perforation, adverse medication reaction leading to cardiac or pulmonary decompensation, pancreatitis (if ERCP).  The limitation of incomplete mucosal visualization was also discussed.  No guarantees or warranties were given.    The patient is appropriate for an endoscopic procedure in the ambulatory setting.   - Wilfrid Lund, MD

## 2021-09-08 NOTE — Progress Notes (Signed)
Pt's states no medical or surgical changes since previsit or office visit. VS assessed by C.W 

## 2021-09-08 NOTE — Progress Notes (Unsigned)
Report to PACU, RN, vss, BBS= Clear.  

## 2021-09-08 NOTE — Patient Instructions (Addendum)
Handout was given to your care partner on polyps. Your sugar was 95 in the recovery room. You may resume your current medications today. Await biopsy results.  May take 1-3 weeks to receive pathology results. No cause seen for weight loss, which does not appear to be of a Gastrointestinal (GI) cause. Repeat colonoscopy in 1 year for surveillance (polyps, pre quality).  Closer attention to pre-procedure dietary restrictions.  Additional laxative treatment for 2-3 days prior to prep day, and consume more water with prep for next exam. Please call if any questions or concerns.    YOU HAD AN ENDOSCOPIC PROCEDURE TODAY AT [facility]:   Refer to the procedure report that was given to you for any specific questions about what was found during the examination.  If the procedure report does not answer your questions, please call your gastroenterologist to clarify.  If you requested that your care partner not be given the details of your procedure findings, then the procedure report has been included in a sealed envelope for you to review at your convenience later.  YOU SHOULD EXPECT: Some feelings of bloating in the abdomen. Passage of more gas than usual.  Walking can help get rid of the air that was put into your GI tract during the procedure and reduce the bloating. If you had a lower endoscopy (such as a colonoscopy or flexible sigmoidoscopy) you may notice spotting of blood in your stool or on the toilet paper. If you underwent a bowel prep for your procedure, you may not have a normal bowel movement for a few days.  Please Note:  You might notice some irritation and congestion in your nose or some drainage.  This is from the oxygen used during your procedure.  There is no need for concern and it should clear up in a day or so.  SYMPTOMS TO REPORT IMMEDIATELY:  Following lower endoscopy (colonoscopy or flexible sigmoidoscopy):  Excessive amounts of blood in the stool  Significant  tenderness or worsening of abdominal pains  Swelling of the abdomen that is new, acute  Fever of 100F or higher  Following upper endoscopy (EGD)  Vomiting of blood or coffee ground material  New chest pain or pain under the shoulder blades  Painful or persistently difficult swallowing  New shortness of breath  Fever of 100F or higher  Black, tarry-looking stools  For urgent or emergent issues, a gastroenterologist can be reached at any hour by calling [phone]. Do not use MyChart messaging for urgent concerns.    DIET:  We do recommend a small meal at first, but then you may proceed to your regular diet.  Drink plenty of fluids but you should avoid alcoholic beverages for 24 hours.  ACTIVITY:  You should plan to take it easy for the rest of today and you should NOT DRIVE or use heavy machinery until tomorrow (because of the sedation medicines used during the test).    FOLLOW UP: Our staff will call the number listed on your records 48-72 hours following your procedure to check on you and address any questions or concerns that you may have regarding the information given to you following your procedure. If we do not reach you, we will leave a message.  We will attempt to reach you two times.  During this call, we will ask if you have developed any symptoms of COVID 19. If you develop any symptoms (ie: fever, flu-like symptoms, shortness of breath, cough etc.) before then, please  call [phone].  If you test positive for Covid 19 in the 2 weeks post procedure, please call and report this information to [place].    If any biopsies were taken you will be contacted by phone or by letter within the next 1-3 weeks.  Please call us at [phone] if you have not heard about the biopsies in 3 weeks.    SIGNATURES/CONFIDENTIALITY: You and/or your care partner have signed paperwork which will be entered into your electronic medical record.  These signatures attest to the fact that that the  information above on your After Visit Summary has been reviewed and is understood.  Full responsibility of the confidentiality of this discharge information lies with you and/or your care-partner.

## 2021-09-08 NOTE — Progress Notes (Unsigned)
Food in stomach  sedation halted

## 2021-09-08 NOTE — Op Note (Signed)
[facility] Patient Name: Deborah Camacho Procedure Date: 09/08/2021 2:54 PM MRN: [id number] Endoscopist: Mallie Mussel L. Loletha Carrow , MD Age: 66 Referring MD:  Date of Birth: 1955/10/04 Gender: Female Account #: [id number] Procedure:                Upper GI endoscopy Indications:              Heartburn, Weight loss (both intermittent)                           today weight reported to fluctuate (see office                            consult note for more details) Medicines:                Monitored Anesthesia Care Procedure:                Pre-Anesthesia Assessment:                           - Prior to the procedure, a History and Physical                            was performed, and patient medications and                            allergies were reviewed. The patient's tolerance of                            previous anesthesia was also reviewed. The risks                            and benefits of the procedure and the sedation                            options and risks were discussed with the patient.                            All questions were answered, and informed consent                            was obtained. Prior Anticoagulants: The patient has                            taken no previous anticoagulant or antiplatelet                            agents. ASA Grade Assessment: III - A patient with                            severe systemic disease. After reviewing the risks                            and benefits, the patient was deemed in  satisfactory condition to undergo the procedure.                           After obtaining informed consent, the endoscope was                            passed under direct vision. Throughout the                            procedure, the patient's blood pressure, pulse, and                            oxygen saturations were monitored continuously. The                            Olympus Endoscope [id number] was  introduced through                            the mouth, and advanced to the second part of                            duodenum. The upper GI endoscopy was accomplished                            without difficulty. The patient tolerated the                            procedure well. Scope In: Scope Out: Findings:                 The esophagus was normal.                           A large amount of food (residue) was found in the                            entire examined stomach. This significantly limited                            visualization. Patent pylorus, through which the                            scope passed without resistance.                           The examined duodenum was normal. Complications:            No immediate complications. Estimated Blood Loss:     Estimated blood loss: none. Impression:               - Normal esophagus.                           - A large amount of food (residue) in the stomach.                           -  Normal examined duodenum.                           - No specimens collected.                           Notwithstanding the limited visualization from                            retained food, clinically and endoscopically there                            does not appear to be a GI cause of weight loss in                            this patient.                           She also does not have chronic symptoms of                            gastroparesis (last HgbA1c unknown), but the                            retained gastric food is most likely due to GLP-1                            agonist medicine. Recommendation:           - Patient has a contact number available for                            emergencies. The signs and symptoms of potential                            delayed complications were discussed with the                            patient. Return to normal activities tomorrow.                            Written discharge  instructions were provided to the                            patient.                           - Resume previous diet.                           - Continue present medications.                           - See the other procedure note for documentation of  additional recommendations.                           - Return to my office PRN. Amy Gothard L. Loletha Carrow, MD 09/08/2021 4:01:05 PM This report has been signed electronically.

## 2021-09-08 NOTE — Progress Notes (Unsigned)
No problems noted in the recovery room. maw   Dr. Loletha Carrow said he printed 2 colon reports.  He wanted to keep the colon report that recommended repeat colon in 1 yrs d/t prep quality.

## 2021-09-09 ENCOUNTER — Telehealth: Payer: Self-pay | Admitting: *Deleted

## 2021-09-09 NOTE — Telephone Encounter (Signed)
First follow up call attempt.  LVM. 

## 2021-09-09 NOTE — Telephone Encounter (Signed)
  Follow up Call-     09/08/2021    2:54 PM  Call back number  Post procedure Call Back phone  # [phone]  Permission to leave phone message Yes     Patient questions:  Do you have a fever, pain , or abdominal swelling? No. Pain Score  0 *  Have you tolerated food without any problems? Yes.    Have you been able to return to your normal activities? Yes.    Do you have any questions about your discharge instructions: Diet   No. Medications  No. Follow up visit  No.  Do you have questions or concerns about your Care? No.  Actions: * If pain score is 4 or above: No action needed, pain <4.

## 2021-09-12 ENCOUNTER — Encounter: Payer: Self-pay | Admitting: Gastroenterology

## 2021-11-14 ENCOUNTER — Other Ambulatory Visit: Payer: Self-pay | Admitting: Specialist

## 2021-11-14 DIAGNOSIS — Z1231 Encounter for screening mammogram for malignant neoplasm of breast: Secondary | ICD-10-CM

## 2021-11-17 ENCOUNTER — Other Ambulatory Visit (HOSPITAL_COMMUNITY): Payer: Self-pay | Admitting: Specialist

## 2021-11-17 ENCOUNTER — Ambulatory Visit: Payer: Medicare Other | Admitting: Podiatry

## 2021-11-17 DIAGNOSIS — M79606 Pain in leg, unspecified: Secondary | ICD-10-CM

## 2021-11-19 ENCOUNTER — Ambulatory Visit (HOSPITAL_COMMUNITY)
Admission: RE | Admit: 2021-11-19 | Discharge: 2021-11-19 | Disposition: A | Discharge status: Home / Self Care | Payer: Medicare Other | Source: Ambulatory Visit | Attending: Vascular Surgery | Admitting: Vascular Surgery

## 2021-11-19 DIAGNOSIS — M79606 Pain in leg, unspecified: Secondary | ICD-10-CM | POA: Diagnosis not present

## 2021-12-02 ENCOUNTER — Encounter: Payer: Self-pay | Admitting: Podiatry

## 2021-12-02 ENCOUNTER — Ambulatory Visit (INDEPENDENT_AMBULATORY_CARE_PROVIDER_SITE_OTHER): Payer: Medicare Other | Admitting: Podiatry

## 2021-12-02 DIAGNOSIS — M79674 Pain in right toe(s): Secondary | ICD-10-CM

## 2021-12-02 DIAGNOSIS — E1140 Type 2 diabetes mellitus with diabetic neuropathy, unspecified: Secondary | ICD-10-CM

## 2021-12-02 DIAGNOSIS — B351 Tinea unguium: Secondary | ICD-10-CM | POA: Diagnosis not present

## 2021-12-02 DIAGNOSIS — M25571 Pain in right ankle and joints of right foot: Secondary | ICD-10-CM

## 2021-12-02 DIAGNOSIS — E119 Type 2 diabetes mellitus without complications: Secondary | ICD-10-CM

## 2021-12-02 DIAGNOSIS — E1149 Type 2 diabetes mellitus with other diabetic neurological complication: Secondary | ICD-10-CM | POA: Diagnosis not present

## 2021-12-02 DIAGNOSIS — M79675 Pain in left toe(s): Secondary | ICD-10-CM

## 2021-12-02 DIAGNOSIS — G8929 Other chronic pain: Secondary | ICD-10-CM

## 2021-12-07 NOTE — Progress Notes (Signed)
ANNUAL DIABETIC FOOT EXAM  Subjective: Deborah Camacho presents today for annual diabetic foot examination.  Patient confirms h/o diabetes.  Patient relates >10 year h/o diabetes.  Patient denies any h/o foot wounds.  Patient has been diagnosed with neuropathy and it is managed with gabapentin.  Last known  HgA1c was 7.8% Patient did not check blood glucose this morning.  Risk factors: diabetes, HTN, hyperlipidemia.  Harvie Junior, MD is patient's PCP. Last visit was November 14, 2021.  Patient states she has seen Dr. Paulla Dolly in the past for right ankle pain and received an injection. She relates her right ankle has become symptomatic again and she needs another injection.  Past Medical History:  Diagnosis Date   Arthritis    Arthritis    both legs   Back pain    Diabetes mellitus    TYPE 2   Dyslipidemia    Hand pain    Hyperlipidemia    Hypertension    Leg pain    Vitamin D deficiency    Patient Active Problem List   Diagnosis Date Noted   Complex endometrial hyperplasia with atypia 11/04/2017   Endometrial hyperplasia 10/25/2017   Hypertension associated with diabetes ([place]) 12/01/2011   Obesity (BMI 30-39.9) 12/01/2011   Diabetes mellitus ([place]) 10/27/2010   Osteomalacia, vitamin D deficient 10/27/2010   Hyperlipidemia LDL goal <70 10/27/2010   Chronic back pain greater than 3 months duration 10/27/2010   Past Surgical History:  Procedure Laterality Date   ABDOMINAL HYSTERECTOMY     CARPAL TUNNEL RELEASE Bilateral    ROBOTIC ASSISTED TOTAL HYSTERECTOMY WITH BILATERAL SALPINGO OOPHERECTOMY Bilateral 11/04/2017   Procedure: XI ROBOTIC ASSISTED TOTAL HYSTERECTOMY WITH BILATERAL SALPINGO OOPHORECTOMY;  Surgeon: Everitt Amber, MD;  Location: [facility];  Service: Gynecology;  Laterality: Bilateral;   SENTINEL NODE BIOPSY N/A 11/04/2017   Procedure: SENTINEL LYMPH NODE BIOPSY;  Surgeon: Everitt Amber, MD;  Location: [facility];  Service: Gynecology;  Laterality: N/A;   TUBAL  LIGATION  1978   Current Outpatient Medications on File Prior to Visit  Medication Sig Dispense Refill   atorvastatin (LIPITOR) 40 MG tablet atorvastatin 40 mg tablet     fluconazole (DIFLUCAN) 150 MG tablet      fluticasone (FLONASE) 50 MCG/ACT nasal spray Place 2 sprays into both nostrils daily. (Patient not taking: Reported on 09/08/2021) 16 g 0   gabapentin (NEURONTIN) 100 MG capsule Take 100 mg by mouth 2 (two) times daily.     glimepiride (AMARYL) 4 MG tablet Take 4 mg by mouth at bedtime.  (Patient not taking: Reported on 09/08/2021)  3   hydrOXYzine (ATARAX) 25 MG tablet Take 25 mg by mouth 2 (two) times daily as needed.     INVOKANA 100 MG TABS tablet Take 100 mg by mouth daily.  (Patient not taking: Reported on 09/08/2021)  3   JARDIANCE 25 MG TABS tablet Take 25 mg by mouth daily.     lisinopril-hydrochlorothiazide (PRINZIDE,ZESTORETIC) 20-25 MG tablet Take 1 tablet by mouth daily.      Multiple Vitamin (MULTIVITAMIN WITH MINERALS) TABS tablet Take 1 tablet by mouth daily. (Patient not taking: Reported on 09/08/2021)     NARCAN 4 MG/0.1ML LIQD nasal spray kit CALL 911. ADMINISTER A SINGLE SPRAY OF NARCAN IN ONE NOSTRIL. REPEAT EVERY 3 MINUTES AS NEEDED IF NO OR MINIMAL RESPONSE. (Patient not taking: Reported on 09/08/2021)  0   omeprazole (PRILOSEC) 20 MG capsule Take 20 mg by mouth every morning. (Patient not taking: Reported on  09/08/2021)     Oxycodone HCl 10 MG TABS      oxyCODONE-acetaminophen (PERCOCET) 10-325 MG tablet Take 1 tablet by mouth every 4 (four) hours as needed for pain. 20 tablet 0   rosuvastatin (CRESTOR) 40 MG tablet      temazepam (RESTORIL) 30 MG capsule      traZODone (DESYREL) 50 MG tablet Take 50 mg by mouth at bedtime as needed. (Patient not taking: Reported on 5/40/9811)     TRULICITY 9.14 NW/2.9FA SOPN Inject 0.75 mg as directed every Wednesday.  5   valsartan-hydrochlorothiazide (DIOVAN-HCT) 160-12.5 MG tablet Take 1 tablet by mouth daily.     zolpidem  (AMBIEN) 10 MG tablet      No current facility-administered medications on file prior to visit.    No Known Allergies Social History   Occupational History   Not on file  Tobacco Use   Smoking status: Never   Smokeless tobacco: Never  Vaping Use   Vaping Use: Never used  Substance and Sexual Activity   Alcohol use: No   Drug use: Never   Sexual activity: Not on file   Family History  Problem Relation Age of Onset   Pancreatic cancer Sister    Lung cancer Maternal Grandmother    Breast cancer Maternal Aunt    Breast cancer Maternal Aunt    Pancreatic cancer Maternal Uncle    Lung cancer Maternal Uncle    Breast cancer Cousin    Breast cancer Cousin    Hypertension Other    Cancer Other    Colon cancer Neg Hx    Stomach cancer Neg Hx    Esophageal cancer Neg Hx    Immunization History  Administered Date(s) Administered   Influenza Split 01/15/2012   Influenza Whole 02/28/2009   PFIZER Comirnaty(Gray Top)Covid-19 Tri-Sucrose Vaccine 05/21/2020   Pneumococcal Polysaccharide-23 01/15/2012   Tdap 12/01/2011    Review of Systems: Negative except as noted in the HPI.   Objective: There were no vitals filed for this visit.  Deborah HARNDEN is a pleasant 66 y.o. female in NAD. AAO X 3.  Vascular Examination: CFT immediate b/l LE. Palpable DP/PT pulses b/l LE. Digital hair present b/l. Skin temperature gradient WNL b/l. No pain with calf compression b/l. No edema noted b/l. No cyanosis or clubbing noted b/l LE.  Dermatological Examination: Pedal integument with normal turgor, texture and tone b/l LE. No open wounds b/l. No interdigital macerations b/l. Toenails 1-5 b/l elongated, thickened, discolored with subungual debris. +Tenderness with dorsal palpation of nailplates. No hyperkeratotic or porokeratotic lesions present.  Neurological Examination: Pt has subjective symptoms of neuropathy. Protective sensation decreased with 10 gram monofilament b/l.  Musculoskeletal  Examination: Normal muscle strength 5/5 to all lower extremity muscle groups bilaterally. No pain, crepitus or joint limitation noted with ROM b/l LE. No gross bony pedal deformities b/l. Patient ambulates independently without assistive aids.  Footwear Assessment: Does the patient wear appropriate shoes? Yes. Does the patient need inserts/orthotics? No.  ADA Risk Categorization:  High Risk  Patient has one or more of the following: Loss of protective sensation Absent pedal pulses Severe Foot deformity History of foot ulcer  Assessment: 1. Pain due to onychomycosis of toenails of both feet   2. Chronic pain of right ankle   3. Diabetic neuropathy with neurologic complication ([place])   4. Encounter for diabetic foot exam [facility])     Plan: -Patient was evaluated and treated. All patient's and/or POA's questions/concerns answered on today's visit. -Diabetic  foot examination performed today. -Stressed the importance of good glycemic control and the detriment of not  controlling glucose levels in relation to the foot. -Patient to continue soft, supportive shoe gear daily. -Toenails 1-5 b/l were debrided in length and girth with sterile nail nippers and dremel without iatrogenic bleeding.  -Patient scheduled to see Dr. Ila Mcgill in the future for follow up of right ankle pain/capsulitis. -Patient/POA to call should there be question/concern in the interim. Return in about 3 months (around 03/04/2022).  Marzetta Board, DPM

## 2021-12-10 NOTE — Progress Notes (Signed)
Left message on vm for patient to call back to schedule appointment.

## 2021-12-17 ENCOUNTER — Ambulatory Visit: Payer: Medicare Other | Admitting: Podiatry

## 2022-01-02 ENCOUNTER — Ambulatory Visit: Payer: Medicare Other | Admitting: Podiatry

## 2022-01-12 NOTE — Therapy (Deleted)
OUTPATIENT PHYSICAL THERAPY THORACOLUMBAR EVALUATION   Patient Name: Deborah Camacho MRN: [id number] DOB:[birth year], 66 y.o., female Today's Date: 01/12/2022    Past Medical History:  Diagnosis Date   Arthritis    Arthritis    both legs   Back pain    Diabetes mellitus    TYPE 2   Dyslipidemia    Hand pain    Hyperlipidemia    Hypertension    Leg pain    Vitamin D deficiency    Past Surgical History:  Procedure Laterality Date   ABDOMINAL HYSTERECTOMY     CARPAL TUNNEL RELEASE Bilateral    ROBOTIC ASSISTED TOTAL HYSTERECTOMY WITH BILATERAL SALPINGO OOPHERECTOMY Bilateral 11/04/2017   Procedure: XI ROBOTIC ASSISTED TOTAL HYSTERECTOMY WITH BILATERAL SALPINGO OOPHORECTOMY;  Surgeon: Everitt Amber, MD;  Location: [facility];  Service: Gynecology;  Laterality: Bilateral;   SENTINEL NODE BIOPSY N/A 11/04/2017   Procedure: SENTINEL LYMPH NODE BIOPSY;  Surgeon: Everitt Amber, MD;  Location: [facility];  Service: Gynecology;  Laterality: N/A;   TUBAL LIGATION  1978   Patient Active Problem List   Diagnosis Date Noted   Complex endometrial hyperplasia with atypia 11/04/2017   Endometrial hyperplasia 10/25/2017   Hypertension associated with diabetes ([place]) 12/01/2011   Obesity (BMI 30-39.9) 12/01/2011   Diabetes mellitus ([place]) 10/27/2010   Osteomalacia, vitamin D deficient 10/27/2010   Hyperlipidemia LDL goal <70 10/27/2010   Chronic back pain greater than 3 months duration 10/27/2010    PCP: Harvie Junior, MD  REFERRING PROVIDER: Vallarie Mare, MD   REFERRING DIAG: M54.9 (ICD-10-CM) - Dorsalgia, unspecified   Rationale for Evaluation and Treatment Rehabilitation  THERAPY DIAG: Dorsalgia, unspecified   ONSET DATE: chronic  SUBJECTIVE:                                                                                                                                                                                           SUBJECTIVE STATEMENT: *** PERTINENT HISTORY:   ***  PAIN:  Are you having pain? {OPRCPAIN:27236}   PRECAUTIONS: None  WEIGHT BEARING RESTRICTIONS No  FALLS:  Has patient fallen in last 6 months? No  LIVING ENVIRONMENT: Lives with: lives with their family  OCCUPATION: ***  PLOF: Independent  PATIENT GOALS ***   OBJECTIVE:   DIAGNOSTIC FINDINGS:  None   PATIENT SURVEYS:  FOTO ***  SCREENING FOR RED FLAGS: negative  COGNITION:  Overall cognitive status: Within functional limits for tasks assessed     SENSATION: Not tested  MUSCLE LENGTH: Hamstrings: Right *** deg; Left *** deg Thomas test: Right *** deg; Left *** deg  POSTURE: {posture:25561}  PALPATION: ***  LUMBAR ROM:   {AROM/PROM:27142}  A/PROM  eval  Flexion   Extension   Right lateral flexion   Left lateral flexion   Right rotation   Left rotation    (Blank rows = not tested)  LOWER EXTREMITY ROM:     {AROM/PROM:27142}  Right eval Left eval  Hip flexion    Hip extension    Hip abduction    Hip adduction    Hip internal rotation    Hip external rotation    Knee flexion    Knee extension    Ankle dorsiflexion    Ankle plantarflexion    Ankle inversion    Ankle eversion     (Blank rows = not tested)  LOWER EXTREMITY MMT:    MMT Right eval Left eval  Hip flexion    Hip extension    Hip abduction    Hip adduction    Hip internal rotation    Hip external rotation    Knee flexion    Knee extension    Ankle dorsiflexion    Ankle plantarflexion    Ankle inversion    Ankle eversion     (Blank rows = not tested)  LUMBAR SPECIAL TESTS:  {lumbar special test:25242}  FUNCTIONAL TESTS:  {Functional tests:24029}  GAIT: Distance walked: *** Assistive device utilized: {Assistive devices:23999} Level of assistance: {Levels of assistance:24026} Comments: ***    TODAY'S TREATMENT  ***   PATIENT EDUCATION:  Education details: *** Person educated: {Person educated:25204} Education method: {Education  Method:25205} Education comprehension: {Education Comprehension:25206}   HOME EXERCISE PROGRAM: ***  ASSESSMENT:  CLINICAL IMPRESSION: Patient is a *** y.o. *** who was seen today for physical therapy evaluation and treatment for ***.    OBJECTIVE IMPAIRMENTS {opptimpairments:25111}.   ACTIVITY LIMITATIONS {activitylimitations:27494}  PARTICIPATION LIMITATIONS: {participationrestrictions:25113}  PERSONAL FACTORS {Personal factors:25162} are also affecting patient's functional outcome.   REHAB POTENTIAL: {rehabpotential:25112}  CLINICAL DECISION MAKING: {clinical decision making:25114}  EVALUATION COMPLEXITY: {Evaluation complexity:25115}   GOALS: Goals reviewed with patient? {yes/no:20286}  SHORT TERM GOALS: Target date: {follow up:25551}  *** Baseline: Goal status: {GOALSTATUS:25110}  2.  *** Baseline:  Goal status: {GOALSTATUS:25110}  3.  *** Baseline:  Goal status: {GOALSTATUS:25110}  4.  *** Baseline:  Goal status: {GOALSTATUS:25110}  5.  *** Baseline:  Goal status: {GOALSTATUS:25110}  6.  *** Baseline:  Goal status: {GOALSTATUS:25110}  LONG TERM GOALS: Target date: {follow up:25551}  *** Baseline:  Goal status: {GOALSTATUS:25110}  2.  *** Baseline:  Goal status: {GOALSTATUS:25110}  3.  *** Baseline:  Goal status: {GOALSTATUS:25110}  4.  *** Baseline:  Goal status: {GOALSTATUS:25110}  5.  *** Baseline:  Goal status: {GOALSTATUS:25110}  6.  *** Baseline:  Goal status: {GOALSTATUS:25110}   PLAN: PT FREQUENCY: {rehab frequency:25116}  PT DURATION: {rehab duration:25117}  PLANNED INTERVENTIONS: {rehab planned interventions:25118::"Therapeutic exercises","Therapeutic activity","Neuromuscular re-education","Balance training","Gait training","Patient/Family education","Self Care","Joint mobilization"}.  PLAN FOR NEXT SESSION: ***   Lanice Shirts, PT 01/12/2022, 1:21 PM

## 2022-01-14 ENCOUNTER — Ambulatory Visit: Payer: Medicare Other

## 2022-01-26 ENCOUNTER — Ambulatory Visit: Payer: Medicare Other | Admitting: Physical Therapy

## 2022-01-26 NOTE — Therapy (Deleted)
OUTPATIENT PHYSICAL THERAPY THORACOLUMBAR EVALUATION   Patient Name: Deborah Camacho MRN: [id number] DOB:06/18/1955, 66 y.o., female Today's Date: 01/26/2022    Past Medical History:  Diagnosis Date   Arthritis    Arthritis    both legs   Back pain    Diabetes mellitus    TYPE 2   Dyslipidemia    Hand pain    Hyperlipidemia    Hypertension    Leg pain    Vitamin D deficiency    Past Surgical History:  Procedure Laterality Date   ABDOMINAL HYSTERECTOMY     CARPAL TUNNEL RELEASE Bilateral    ROBOTIC ASSISTED TOTAL HYSTERECTOMY WITH BILATERAL SALPINGO OOPHERECTOMY Bilateral 11/04/2017   Procedure: XI ROBOTIC ASSISTED TOTAL HYSTERECTOMY WITH BILATERAL SALPINGO OOPHORECTOMY;  Surgeon: Everitt Amber, MD;  Location: [facility];  Service: Gynecology;  Laterality: Bilateral;   SENTINEL NODE BIOPSY N/A 11/04/2017   Procedure: SENTINEL LYMPH NODE BIOPSY;  Surgeon: Everitt Amber, MD;  Location: [facility];  Service: Gynecology;  Laterality: N/A;   TUBAL LIGATION  1978   Patient Active Problem List   Diagnosis Date Noted   Complex endometrial hyperplasia with atypia 11/04/2017   Endometrial hyperplasia 10/25/2017   Hypertension associated with diabetes ([place]) 12/01/2011   Obesity (BMI 30-39.9) 12/01/2011   Diabetes mellitus ([place]) 10/27/2010   Osteomalacia, vitamin D deficient 10/27/2010   Hyperlipidemia LDL goal <70 10/27/2010   Chronic back pain greater than 3 months duration 10/27/2010    PCP: Harvie Junior, MD  REFERRING PROVIDER: Vallarie Mare, MD  THERAPY DIAG:  No diagnosis found.  REFERRING DIAG: Dorsalgia, unspecified [M54.9]  Rationale for Evaluation and Treatment Rehabilitation  SUBJECTIVE:  PERTINENT PAST HISTORY:  diabetes        PRECAUTIONS: {Therapy precautions:24002}  WEIGHT BEARING RESTRICTIONS {Yes ***/No:24003}  FALLS:  Has patient fallen in last 6 months? {yes/no:20286}, Number of falls: ***  MOI/History of condition:  Onset date:  ***  MA Deborah Camacho is a 66 y.o. female who presents to clinic with chief complaint of ***.  ***  From referring provider:   "***"   Red flags:  {has/denies:26543} {kerredflag:26542}  Pain:  Are you having pain? {yes/no:20286} Pain location: *** NPRS scale:  current {NUMBERS; 0-10:5044}/10  average {NUMBERS; 0-10:5044}/10  Aggravating factors: ***  NPRS, highest: {NUMBERS; 0-10:5044}/10 Relieving factors: ***  NPRS: best: {NUMBERS; 0-10:5044}/10 Pain description: {PAIN DESCRIPTION:21022940} Stage: {Desc; acute/subacute/chronic:13799} Stability: {kerbetterworse:26715} 24 hour pattern: ***   Occupation: ***  Assistive Device: ***  Hand Dominance: ***  Patient Goals/Specific Activities: ***   OBJECTIVE:   DIAGNOSTIC FINDINGS:  ***  GENERAL OBSERVATION/GAIT:  ***  SENSATION:  Light touch: {intact/deficits:24005}  MUSCLE LENGTH: Hamstrings: Right {kerminsig:27227} restriction; Left {kerminsig:27227} restriction ASLR: Right {keraslr:27228}; Left {keraslr:27228} Marcello Moores test: Right {kerminsig:27227} restriction; Left {kerminsig:27227} restriction Ely's test: Right {kerminsig:27227} restriction; Left {kerminsig:27227} restriction    LUMBAR AROM  AROM AROM  01/26/2022  Flexion {kerromcxlx:26716}  Extension {kerromcxlx:26716}  Right lateral flexion {kerromcxlx:26716}  Left lateral flexion {kerromcxlx:26716}  Right rotation {kerromcxlx:26716}  Left rotation {kerromcxlx:26716}    (Blank rows = not tested)  DIRECTIONAL PREFERENCE:  ***  LE MMT:  MMT Right 01/26/2022 Left 01/26/2022  Hip flexion (L2, L3) *** ***  Knee extension (L3) *** ***  Knee flexion *** ***  Hip abduction *** ***  Hip extension *** ***  Hip external rotation    Hip internal rotation    Hip adduction    Ankle dorsiflexion (L4)    Ankle plantarflexion (S1)    Ankle  inversion    Ankle eversion    Great Toe ext (L5)    Grossly     (Blank rows = not tested, score listed is out  of 5 possible points.  N = WNL, D = diminished, C = clear for gross weakness with myotome testing, * = concordant pain with testing)   LE ROM:  ROM Right 01/26/2022 Left 01/26/2022  Hip flexion    Hip extension    Hip abduction    Hip adduction    Hip internal rotation    Hip external rotation    Knee flexion    Knee extension    Ankle dorsiflexion    Ankle plantarflexion    Ankle inversion    Ankle eversion      (Blank rows = not tested, N = WNL, * = concordant pain with testing)  Functional Tests  Eval (01/26/2022)    30'' STS: ***x  UE used? ***                                                           LUMBAR SPECIAL TESTS:  Straight leg raise: L (***), R (***) Slump: L (***), R (***)   PALPATION:   ***   SPINAL SEGMENTAL MOBILITY ASSESSMENT:  ***  PATIENT SURVEYS:  {rehab surveys:24030}   TODAY'S TREATMENT  ***  PATIENT EDUCATION:  POC, diagnosis, prognosis, HEP, and outcome measures.  Pt educated via explanation, demonstration, and handout (HEP).  Pt confirms understanding verbally.   HOME EXERCISE PROGRAM: ***  ASTERISK SIGNS   Asterisk Signs Eval (01/26/2022)                                                 ASSESSMENT:  CLINICAL IMPRESSION: Jyll is a 66 y.o. female who presents to clinic with signs and sxs consistent with ***.    OBJECTIVE IMPAIRMENTS: Pain, ***  ACTIVITY LIMITATIONS: ***  PERSONAL FACTORS: See medical history and pertinent history   REHAB POTENTIAL: {rehabpotential:25112}  CLINICAL DECISION MAKING: {clinical decision making:25114}  EVALUATION COMPLEXITY: {Evaluation complexity:25115}   GOALS:   SHORT TERM GOALS: Target date: 02/16/2022  Deborah Camacho will be >75% HEP compliant to improve carryover between sessions and facilitate independent management of condition  Evaluation (01/26/2022): ongoing Goal status: INITIAL   LONG TERM GOALS: Target date: 03/23/2022  Deborah Camacho will improve FOTO score  to *** as a proxy for functional improvement  Evaluation/Baseline (01/26/2022): *** Goal status: INITIAL   2.  Deborah Camacho will self report >/= 50% decrease in pain from evaluation   Evaluation/Baseline (01/26/2022): ***/10 max pain Goal status: INITIAL   3.  ***   4.  ***   5.  ***   6.  ***   PLAN: PT FREQUENCY: 1-2x/week  PT DURATION: 8 weeks (Ending 03/23/2022)  PLANNED INTERVENTIONS: Therapeutic exercises, Aquatic therapy, Therapeutic activity, Neuro Muscular re-education, Gait training, Patient/Family education, Joint mobilization, Dry Needling, Electrical stimulation, Spinal mobilization and/or manipulation, Moist heat, Taping, Vasopneumatic device, Ionotophoresis '4mg'$ /ml Dexamethasone, and Manual therapy  PLAN FOR NEXT SESSION: ***   Shearon Balo PT, DPT 01/26/2022, 7:38 AM

## 2022-01-28 ENCOUNTER — Ambulatory Visit: Payer: Medicare Other | Admitting: Podiatry

## 2022-02-02 NOTE — Therapy (Unsigned)
OUTPATIENT PHYSICAL THERAPY THORACOLUMBAR EVALUATION   Patient Name: Deborah Camacho MRN: [id number] DOB:January 30, 1956, 66 y.o., female Today's Date: 02/03/2022   PT End of Session - 02/03/22 1537     Visit Number 1    Number of Visits 4    Date for PT Re-Evaluation 03/31/22    Authorization Type [employer]    PT Start Time 9323    PT Stop Time 5573    PT Time Calculation (min) 45 min    Activity Tolerance Patient tolerated treatment well    Behavior During Therapy WFL for tasks assessed/performed             Past Medical History:  Diagnosis Date   Arthritis    Arthritis    both legs   Back pain    Diabetes mellitus    TYPE 2   Dyslipidemia    Hand pain    Hyperlipidemia    Hypertension    Leg pain    Vitamin D deficiency    Past Surgical History:  Procedure Laterality Date   ABDOMINAL HYSTERECTOMY     CARPAL TUNNEL RELEASE Bilateral    ROBOTIC ASSISTED TOTAL HYSTERECTOMY WITH BILATERAL SALPINGO OOPHERECTOMY Bilateral 11/04/2017   Procedure: XI ROBOTIC ASSISTED TOTAL HYSTERECTOMY WITH BILATERAL SALPINGO OOPHORECTOMY;  Surgeon: Everitt Amber, MD;  Location: [facility];  Service: Gynecology;  Laterality: Bilateral;   SENTINEL NODE BIOPSY N/A 11/04/2017   Procedure: SENTINEL LYMPH NODE BIOPSY;  Surgeon: Everitt Amber, MD;  Location: [facility];  Service: Gynecology;  Laterality: N/A;   TUBAL LIGATION  1978   Patient Active Problem List   Diagnosis Date Noted   Complex endometrial hyperplasia with atypia 11/04/2017   Endometrial hyperplasia 10/25/2017   Hypertension associated with diabetes ([place]) 12/01/2011   Obesity (BMI 30-39.9) 12/01/2011   Diabetes mellitus ([place]) 10/27/2010   Osteomalacia, vitamin D deficient 10/27/2010   Hyperlipidemia LDL goal <70 10/27/2010   Chronic back pain greater than 3 months duration 10/27/2010    PCP: Harvie Junior, MD  REFERRING PROVIDER: Vallarie Mare, MD  REFERRING DIAG: M54.9 (ICD-10-CM) - Dorsalgia, unspecified   Rationale for  Evaluation and Treatment Rehabilitation  THERAPY DIAG: Dorsalgia, unspecified   ONSET DATE: chronic  SUBJECTIVE:                                                                                                                                                                                           SUBJECTIVE STATEMENT: Relates a long history of thoracic pain worse with prolonged activities such as housekeeping, symptoms resolve with rest PERTINENT HISTORY:  None available  PAIN:  Are you having pain? Yes:  NPRS scale: 8/10 Pain location: thoracic spine Pain description: ache Aggravating factors: prolonged positioning Relieving factors: rest   PRECAUTIONS: None  WEIGHT BEARING RESTRICTIONS: No  FALLS:  Has patient fallen in last 6 months? No  LIVING ENVIRONMENT: Lives with: lives with their family  OCCUPATION: retired  PLOF: Independent  PATIENT GOALS: To resolve my thoracic symptoms   OBJECTIVE:   DIAGNOSTIC FINDINGS:  None available  PATIENT SURVEYS:  FOTO 45(50 predicted)  SCREENING FOR RED FLAGS:  negative  COGNITION:  Overall cognitive status: Within functional limits for tasks assessed     SENSATION: WFL  MUSCLE LENGTH: Hamstrings: Right 75 deg; Left 80 deg Thomas test: restricted B  POSTURE: rounded shoulders and increased lumbar lordosis  PALPATION: Not tested  LUMBAR ROM:   AROM eval  Flexion 75%  Extension 75%  Right lateral flexion 90%  Left lateral flexion 90%  Right rotation 50%  Left rotation 25%   (Blank rows = not tested)  LOWER EXTREMITY ROM:     Active  Right eval Left eval  Hip flexion [facility]  Hip extension restricted restricted  Hip abduction    Hip adduction    Hip internal rotation restricted restricted  Hip external rotation restricted restricted  Knee flexion [facility] WFL  Knee extension [facility]  Ankle dorsiflexion    Ankle plantarflexion    Ankle inversion    Ankle eversion     (Blank rows = not  tested)  LOWER EXTREMITY MMT:    MMT Right eval Left eval  Hip flexion    Hip extension    Hip abduction    Hip adduction    Hip internal rotation    Hip external rotation    Knee flexion    Knee extension    Ankle dorsiflexion    Ankle plantarflexion    Ankle inversion    Ankle eversion     (Blank rows = not tested)  LUMBAR SPECIAL TESTS:  Straight leg raise test: Negative, Slump test: Negative, FABER test: Positive, and Thomas test: Positive  FUNCTIONAL TESTS:  5 times sit to stand: 29s arms crossed  GAIT: Distance walked: 9f x2 Assistive device utilized: None Level of assistance: Complete Independence Comments: slow cadence with decreased trunk rotation    TODAY'S TREATMENT:    PATIENT EDUCATION:  Education details: Discussed eval findings, rehab rationale and POC and patient is in agreement  Person educated: Patient Education method: Explanation Education comprehension: verbalized understanding and needs further education   HOME EXERCISE PROGRAM: Access Code: [email or web address]: [email or web address] Date: 02/03/2022 Prepared by: JSharlynn Oliphant Exercises - Hooklying Single Knee to Chest Stretch  - 1 x daily - 5 x weekly - 3 sets - 3 reps - 30s hold - Supine Bridge  - 1 x daily - 5 x weekly - 1 sets - 10 reps - Supine Shoulder Flexion Extension AAROM with Dowel  - 1 x daily - 5 x weekly - 1 sets - 15 reps  ASSESSMENT:  CLINICAL IMPRESSION: Patient is a 66y.o. female who was seen today for physical therapy evaluation and treatment for chronic thoracic pain. She denies radiating symptoms, pain is intermittent, brought on with prolonged activity and positioning resolved with rest.  Eval findings suggest myofacial restrictions w/o neuro involvement   OBJECTIVE IMPAIRMENTS: Abnormal gait, decreased activity tolerance, decreased knowledge of condition, decreased mobility, increased fascial restrictions, impaired flexibility, impaired UE  functional use, improper body mechanics, postural dysfunction, and pain.   ACTIVITY LIMITATIONS: carrying, lifting, bending, sitting,  and reach over head    PERSONAL FACTORS: Age and Time since onset of injury/illness/exacerbation are also affecting patient's functional outcome.   REHAB POTENTIAL: Good  CLINICAL DECISION MAKING: Stable/uncomplicated  EVALUATION COMPLEXITY: Low   GOALS: Goals reviewed with patient? Yes  SHORT TERM GOALS=LONG TERM GOALS: Target date: 03/03/2022  Patient to demonstrate independence in HEP  Baseline: [id number] Goal status: INITIAL  2.  Decrease 5x STS time to 20s arms crossed Baseline: 29s arms crossed Goal status: INITIAL  3.  Decrease worst pain to 6/10 Baseline: 8/10 Goal status: INITIAL  4.  Increase FOTO score to 59 Baseline: 45 Goal status: INITIAL    2  PLAN: PT FREQUENCY: 1-2x/week  PT DURATION: 4 weeks  PLANNED INTERVENTIONS: Therapeutic exercises, Therapeutic activity, Neuromuscular re-education, Balance training, Gait training, Patient/Family education, Self Care, Joint mobilization, Dry Needling, Spinal mobilization, Manual therapy, and Re-evaluation.  PLAN FOR NEXT SESSION: HEP review and update, aerobic tasks, posture and Arts development officer training, thoracic strengthening and mobilization   Lanice Shirts, PT 02/03/2022, 3:39 PM

## 2022-02-03 ENCOUNTER — Ambulatory Visit: Payer: Medicare Other | Attending: Neurosurgery

## 2022-02-03 ENCOUNTER — Other Ambulatory Visit: Payer: Self-pay

## 2022-02-03 DIAGNOSIS — S29019A Strain of muscle and tendon of unspecified wall of thorax, initial encounter: Secondary | ICD-10-CM | POA: Insufficient documentation

## 2022-02-03 DIAGNOSIS — M6281 Muscle weakness (generalized): Secondary | ICD-10-CM | POA: Diagnosis present

## 2022-02-11 ENCOUNTER — Ambulatory Visit: Payer: Medicare Other

## 2022-02-11 ENCOUNTER — Ambulatory Visit: Payer: Medicare Other | Admitting: Podiatry

## 2022-02-11 NOTE — Therapy (Deleted)
OUTPATIENT PHYSICAL THERAPY TREATMENT NOTE   Patient Name: Deborah Camacho MRN: [id number] DOB:03/31/1956, 66 y.o., female Today's Date: 02/11/2022  PCP: Harvie Junior, MD REFERRING PROVIDER: Vallarie Mare, MD  END OF SESSION:    Past Medical History:  Diagnosis Date   Arthritis    Arthritis    both legs   Back pain    Diabetes mellitus    TYPE 2   Dyslipidemia    Hand pain    Hyperlipidemia    Hypertension    Leg pain    Vitamin D deficiency    Past Surgical History:  Procedure Laterality Date   ABDOMINAL HYSTERECTOMY     CARPAL TUNNEL RELEASE Bilateral    ROBOTIC ASSISTED TOTAL HYSTERECTOMY WITH BILATERAL SALPINGO OOPHERECTOMY Bilateral 11/04/2017   Procedure: XI ROBOTIC ASSISTED TOTAL HYSTERECTOMY WITH BILATERAL SALPINGO OOPHORECTOMY;  Surgeon: Everitt Amber, MD;  Location: [facility];  Service: Gynecology;  Laterality: Bilateral;   SENTINEL NODE BIOPSY N/A 11/04/2017   Procedure: SENTINEL LYMPH NODE BIOPSY;  Surgeon: Everitt Amber, MD;  Location: [facility];  Service: Gynecology;  Laterality: N/A;   TUBAL LIGATION  1978   Patient Active Problem List   Diagnosis Date Noted   Complex endometrial hyperplasia with atypia 11/04/2017   Endometrial hyperplasia 10/25/2017   Hypertension associated with diabetes ([place]) 12/01/2011   Obesity (BMI 30-39.9) 12/01/2011   Diabetes mellitus ([place]) 10/27/2010   Osteomalacia, vitamin D deficient 10/27/2010   Hyperlipidemia LDL goal <70 10/27/2010   Chronic back pain greater than 3 months duration 10/27/2010    REFERRING DIAG: M54.9 (ICD-10-CM) - Dorsalgia, unspecified   THERAPY DIAG: Dorsalgia, unspecified   Rationale for Evaluation and Treatment Rehabilitation  PERTINENT HISTORY: None available  PRECAUTIONS: None  SUBJECTIVE:                                                                                                                                                                                      SUBJECTIVE  STATEMENT:  ***   PAIN:  Are you having pain? {OPRCPAIN:27236}   OBJECTIVE: (objective measures completed at initial evaluation unless otherwise dated)   DIAGNOSTIC FINDINGS:  None available   PATIENT SURVEYS:  FOTO 45(50 predicted)   SCREENING FOR RED FLAGS:            negative   COGNITION:           Overall cognitive status: Within functional limits for tasks assessed                          SENSATION: WFL   MUSCLE LENGTH: Hamstrings: Right 75 deg; Left 80 deg Marcello Moores  test: restricted B   POSTURE: rounded shoulders and increased lumbar lordosis   PALPATION: Not tested   LUMBAR ROM:    AROM eval  Flexion 75%  Extension 75%  Right lateral flexion 90%  Left lateral flexion 90%  Right rotation 50%  Left rotation 25%   (Blank rows = not tested)   LOWER EXTREMITY ROM:      Active  Right eval Left eval  Hip flexion [facility]  Hip extension restricted restricted  Hip abduction      Hip adduction      Hip internal rotation restricted restricted  Hip external rotation restricted restricted  Knee flexion [facility] WFL  Knee extension [facility]  Ankle dorsiflexion      Ankle plantarflexion      Ankle inversion      Ankle eversion       (Blank rows = not tested)   LOWER EXTREMITY MMT:     MMT Right eval Left eval  Hip flexion      Hip extension      Hip abduction      Hip adduction      Hip internal rotation      Hip external rotation      Knee flexion      Knee extension      Ankle dorsiflexion      Ankle plantarflexion      Ankle inversion      Ankle eversion       (Blank rows = not tested)   LUMBAR SPECIAL TESTS:  Straight leg raise test: Negative, Slump test: Negative, FABER test: Positive, and Thomas test: Positive   FUNCTIONAL TESTS:  5 times sit to stand: 29s arms crossed   GAIT: Distance walked: 43f x2 Assistive device utilized: None Level of assistance: Complete Independence Comments: slow cadence with decreased trunk rotation        TODAY'S TREATMENT:      PATIENT EDUCATION:  Education details: Discussed eval findings, rehab rationale and POC and patient is in agreement  Person educated: Patient Education method: Explanation Education comprehension: verbalized understanding and needs further education     HOME EXERCISE PROGRAM: Access Code: [email or web address]: [email or web address] Date: 02/03/2022 Prepared by: JSharlynn Oliphant  Exercises - Hooklying Single Knee to Chest Stretch  - 1 x daily - 5 x weekly - 3 sets - 3 reps - 30s hold - Supine Bridge  - 1 x daily - 5 x weekly - 1 sets - 10 reps - Supine Shoulder Flexion Extension AAROM with Dowel  - 1 x daily - 5 x weekly - 1 sets - 15 reps   ASSESSMENT:   CLINICAL IMPRESSION: Patient is a 66y.o. female who was seen today for physical therapy evaluation and treatment for chronic thoracic pain. She denies radiating symptoms, pain is intermittent, brought on with prolonged activity and positioning resolved with rest.  Eval findings suggest myofacial restrictions w/o neuro involvement     OBJECTIVE IMPAIRMENTS: Abnormal gait, decreased activity tolerance, decreased knowledge of condition, decreased mobility, increased fascial restrictions, impaired flexibility, impaired UE functional use, improper body mechanics, postural dysfunction, and pain.    ACTIVITY LIMITATIONS: carrying, lifting, bending, sitting, and reach over head       PERSONAL FACTORS: Age and Time since onset of injury/illness/exacerbation are also affecting patient's functional outcome.    REHAB POTENTIAL: Good   CLINICAL DECISION MAKING: Stable/uncomplicated   EVALUATION COMPLEXITY: Low     GOALS: Goals reviewed with patient? Yes  SHORT TERM GOALS=LONG TERM GOALS: Target date: 03/03/2022   Patient to demonstrate independence in HEP  Baseline: [id number] Goal status: INITIAL   2.  Decrease 5x STS time to 20s arms crossed Baseline: 29s arms crossed Goal status: INITIAL    3.  Decrease worst pain to 6/10 Baseline: 8/10 Goal status: INITIAL   4.  Increase FOTO score to 59 Baseline: 45 Goal status: INITIAL       2   PLAN: PT FREQUENCY: 1-2x/week   PT DURATION: 4 weeks   PLANNED INTERVENTIONS: Therapeutic exercises, Therapeutic activity, Neuromuscular re-education, Balance training, Gait training, Patient/Family education, Self Care, Joint mobilization, Dry Needling, Spinal mobilization, Manual therapy, and Re-evaluation.   PLAN FOR NEXT SESSION: HEP review and update, aerobic tasks, posture and Arts development officer training, thoracic strengthening and mobilization    Lanice Shirts, PT 02/11/2022, 9:57 AM

## 2022-02-18 ENCOUNTER — Ambulatory Visit: Payer: Medicare Other

## 2022-02-25 ENCOUNTER — Ambulatory Visit: Payer: Medicare Other | Attending: Neurosurgery

## 2022-02-25 DIAGNOSIS — S29019A Strain of muscle and tendon of unspecified wall of thorax, initial encounter: Secondary | ICD-10-CM

## 2022-02-25 DIAGNOSIS — M6281 Muscle weakness (generalized): Secondary | ICD-10-CM

## 2022-02-25 NOTE — Therapy (Signed)
OUTPATIENT PHYSICAL THERAPY TREATMENT NOTE   Patient Name: Deborah Camacho MRN: [id number] DOB:January 30, 1956, 66 y.o., female Today's Date: 02/25/2022  PCP: Harvie Junior, MD   REFERRING PROVIDER: Vallarie Mare, MD   END OF SESSION:   PT End of Session - 02/25/22 1449     Visit Number 2    Number of Visits 4    Date for PT Re-Evaluation 03/31/22    Authorization Type U[employer]    PT Start Time 5188    PT Stop Time 1525    PT Time Calculation (min) 40 min    Activity Tolerance Patient tolerated treatment well    Behavior During Therapy WFL for tasks assessed/performed             Past Medical History:  Diagnosis Date   Arthritis    Arthritis    both legs   Back pain    Diabetes mellitus    TYPE 2   Dyslipidemia    Hand pain    Hyperlipidemia    Hypertension    Leg pain    Vitamin D deficiency    Past Surgical History:  Procedure Laterality Date   ABDOMINAL HYSTERECTOMY     CARPAL TUNNEL RELEASE Bilateral    ROBOTIC ASSISTED TOTAL HYSTERECTOMY WITH BILATERAL SALPINGO OOPHERECTOMY Bilateral 11/04/2017   Procedure: XI ROBOTIC ASSISTED TOTAL HYSTERECTOMY WITH BILATERAL SALPINGO OOPHORECTOMY;  Surgeon: Everitt Amber, MD;  Location: [facility];  Service: Gynecology;  Laterality: Bilateral;   SENTINEL NODE BIOPSY N/A 11/04/2017   Procedure: SENTINEL LYMPH NODE BIOPSY;  Surgeon: Everitt Amber, MD;  Location: [facility];  Service: Gynecology;  Laterality: N/A;   TUBAL LIGATION  1978   Patient Active Problem List   Diagnosis Date Noted   Complex endometrial hyperplasia with atypia 11/04/2017   Endometrial hyperplasia 10/25/2017   Hypertension associated with diabetes ([place]) 12/01/2011   Obesity (BMI 30-39.9) 12/01/2011   Diabetes mellitus ([place]) 10/27/2010   Osteomalacia, vitamin D deficient 10/27/2010   Hyperlipidemia LDL goal <70 10/27/2010   Chronic back pain greater than 3 months duration 10/27/2010    REFERRING DIAG: M54.9 (ICD-10-CM) - Dorsalgia, unspecified     THERAPY DIAG: Thoracic pain  Postural dysfunction   Rationale for Evaluation and Treatment Rehabilitation  PERTINENT HISTORY: None available   PRECAUTIONS: None   SUBJECTIVE:                                                                                                                                                                                      SUBJECTIVE STATEMENT:  Returns for first f/u session, has been semi compliant with her HEP.  Overall symptoms unchanged.  Has had  to cancel previous session due to "not feeling well".   PAIN:  Are you having pain? Yes: NPRS scale: 0/10 Pain location: thoracic  Pain description: ache Aggravating factors: cleaning and sweeping tasks Relieving factors: rest   OBJECTIVE: (objective measures completed at initial evaluation unless otherwise dated)  DIAGNOSTIC FINDINGS:  None available   PATIENT SURVEYS:  FOTO 45(50 predicted)   SCREENING FOR RED FLAGS:            negative   COGNITION:           Overall cognitive status: Within functional limits for tasks assessed                          SENSATION: WFL   MUSCLE LENGTH: Hamstrings: Right 75 deg; Left 80 deg Thomas test: restricted B   POSTURE: rounded shoulders and increased lumbar lordosis   PALPATION: Not tested   LUMBAR ROM:    AROM eval  Flexion 75%  Extension 75%  Right lateral flexion 90%  Left lateral flexion 90%  Right rotation 50%  Left rotation 25%   (Blank rows = not tested)   LOWER EXTREMITY ROM:      Active  Right eval Left eval  Hip flexion [facility]  Hip extension restricted restricted  Hip abduction      Hip adduction      Hip internal rotation restricted restricted  Hip external rotation restricted restricted  Knee flexion [facility] WFL  Knee extension [facility]  Ankle dorsiflexion      Ankle plantarflexion      Ankle inversion      Ankle eversion       (Blank rows = not tested)   LOWER EXTREMITY MMT:     MMT Right eval Left eval   Hip flexion      Hip extension      Hip abduction      Hip adduction      Hip internal rotation      Hip external rotation      Knee flexion      Knee extension      Ankle dorsiflexion      Ankle plantarflexion      Ankle inversion      Ankle eversion       (Blank rows = not tested)   LUMBAR SPECIAL TESTS:  Straight leg raise test: Negative, Slump test: Negative, FABER test: Positive, and Thomas test: Positive   FUNCTIONAL TESTS:  5 times sit to stand: 29s arms crossed   GAIT: Distance walked: 90f x2 Assistive device utilized: None Level of assistance: Complete Independence Comments: slow cadence with decreased trunk rotation       TODAY'S TREATMENT:  [facility] Adult PT Treatment:                                                DATE: 02/25/22 Therapeutic Exercise: UBE L1 5 min SKTC 30s x2 B Bridge 15x Supine march 15/15 Supine flexion w/dowel focus on inspiration 15x 90/90 hips/knees  Open book 10/10 Supine Hor Abd YTB 15x Standing ball roll flexion on wall 10x    PATIENT EDUCATION:  Education details: Discussed eval findings, rehab rationale and POC and patient is in agreement  Person educated: Patient Education method: Explanation Education comprehension: verbalized understanding and needs further education  HOME EXERCISE PROGRAM: Access Code: [email or web address] URL: [email or web address] Date: 02/03/2022 Prepared by: Sharlynn Oliphant   Exercises - Hooklying Single Knee to Chest Stretch  - 1 x daily - 5 x weekly - 3 sets - 3 reps - 30s hold - Supine Bridge  - 1 x daily - 5 x weekly - 1 sets - 10 reps - Supine Shoulder Flexion Extension AAROM with Dowel  - 1 x daily - 5 x weekly - 1 sets - 15 reps   ASSESSMENT:   CLINICAL IMPRESSION: Patient returns for first f/u session, symptoms slightly improved, has been partially compliant with HEP.  Today's session focused on HEP review and continued thoracic spine stretching and strengthening exercises.  Added  to HEP incorporating open book exercise.  Included breathing cues for inspiration to promote rib excursion.  Patient encouraged to reschedule missed sessions and is in agreement.     OBJECTIVE IMPAIRMENTS: Abnormal gait, decreased activity tolerance, decreased knowledge of condition, decreased mobility, increased fascial restrictions, impaired flexibility, impaired UE functional use, improper body mechanics, postural dysfunction, and pain.    ACTIVITY LIMITATIONS: carrying, lifting, bending, sitting, and reach over head   PERSONAL FACTORS: Age and Time since onset of injury/illness/exacerbation are also affecting patient's functional outcome.    REHAB POTENTIAL: Good   CLINICAL DECISION MAKING: Stable/uncomplicated   EVALUATION COMPLEXITY: Low     GOALS: Goals reviewed with patient? Yes   SHORT TERM GOALS=LONG TERM GOALS: Target date: 03/03/2022   Patient to demonstrate independence in HEP  Baseline: [id number] Goal status: Partially compliant   2.  Decrease 5x STS time to 20s arms crossed Baseline: 29s arms crossed Goal status: INITIAL   3.  Decrease worst pain to 6/10 Baseline: 8/10 Goal status: INITIAL   4.  Increase FOTO score to 59 Baseline: 45 Goal status: INITIAL      PLAN: PT FREQUENCY: 1-2x/week   PT DURATION: 4 weeks   PLANNED INTERVENTIONS: Therapeutic exercises, Therapeutic activity, Neuromuscular re-education, Balance training, Gait training, Patient/Family education, Self Care, Joint mobilization, Dry Needling, Spinal mobilization, Manual therapy, and Re-evaluation.   PLAN FOR NEXT SESSION: HEP review and update, aerobic tasks, posture and Arts development officer training, thoracic strengthening and mobilization    Lanice Shirts, PT 02/25/2022, 3:37 PM

## 2022-03-04 ENCOUNTER — Ambulatory Visit: Payer: Medicare Other

## 2022-03-04 DIAGNOSIS — S29019A Strain of muscle and tendon of unspecified wall of thorax, initial encounter: Secondary | ICD-10-CM | POA: Diagnosis not present

## 2022-03-04 DIAGNOSIS — M6281 Muscle weakness (generalized): Secondary | ICD-10-CM

## 2022-03-04 NOTE — Therapy (Signed)
OUTPATIENT PHYSICAL THERAPY TREATMENT NOTE   Patient Name: Deborah Camacho MRN: [id number] DOB:[birth year], 66 y.o., female Today's Date: 03/04/2022  PCP: Harvie Junior, MD   REFERRING PROVIDER: Vallarie Mare, MD   END OF SESSION:   PT End of Session - 03/04/22 1400     Visit Number 3    Number of Visits 4    Date for PT Re-Evaluation 03/31/22    Authorization Type UHC    PT Start Time 1400    PT Stop Time 1440    PT Time Calculation (min) 40 min    Activity Tolerance Patient tolerated treatment well    Behavior During Therapy WFL for tasks assessed/performed             Past Medical History:  Diagnosis Date   Arthritis    Arthritis    both legs   Back pain    Diabetes mellitus    TYPE 2   Dyslipidemia    Hand pain    Hyperlipidemia    Hypertension    Leg pain    Vitamin D deficiency    Past Surgical History:  Procedure Laterality Date   ABDOMINAL HYSTERECTOMY     CARPAL TUNNEL RELEASE Bilateral    ROBOTIC ASSISTED TOTAL HYSTERECTOMY WITH BILATERAL SALPINGO OOPHERECTOMY Bilateral 11/04/2017   Procedure: XI ROBOTIC ASSISTED TOTAL HYSTERECTOMY WITH BILATERAL SALPINGO OOPHORECTOMY;  Surgeon: Everitt Amber, MD;  Location: [facility];  Service: Gynecology;  Laterality: Bilateral;   SENTINEL NODE BIOPSY N/A 11/04/2017   Procedure: SENTINEL LYMPH NODE BIOPSY;  Surgeon: Everitt Amber, MD;  Location: [facility];  Service: Gynecology;  Laterality: N/A;   TUBAL LIGATION  1978   Patient Active Problem List   Diagnosis Date Noted   Complex endometrial hyperplasia with atypia 11/04/2017   Endometrial hyperplasia 10/25/2017   Hypertension associated with diabetes ([place]) 12/01/2011   Obesity (BMI 30-39.9) 12/01/2011   Diabetes mellitus ([place]) 10/27/2010   Osteomalacia, vitamin D deficient 10/27/2010   Hyperlipidemia LDL goal <70 10/27/2010   Chronic back pain greater than 3 months duration 10/27/2010    REFERRING DIAG: M54.9 (ICD-10-CM) - Dorsalgia, unspecified     THERAPY DIAG: Thoracic pain  Postural dysfunction   Rationale for Evaluation and Treatment Rehabilitation  PERTINENT HISTORY: None available   PRECAUTIONS: None   SUBJECTIVE:                                                                                                                                                                                      SUBJECTIVE STATEMENT:  Reports an increase in upper back tension but no pain   PAIN:  Are you having pain? Yes:  NPRS scale: 0/10 Pain location: thoracic  Pain description: ache Aggravating factors: cleaning and sweeping tasks Relieving factors: rest   OBJECTIVE: (objective measures completed at initial evaluation unless otherwise dated)  DIAGNOSTIC FINDINGS:  None available   PATIENT SURVEYS:  FOTO 45(50 predicted)   SCREENING FOR RED FLAGS:            negative   COGNITION:           Overall cognitive status: Within functional limits for tasks assessed                          SENSATION: WFL   MUSCLE LENGTH: Hamstrings: Right 75 deg; Left 80 deg Thomas test: restricted B   POSTURE: rounded shoulders and increased lumbar lordosis   PALPATION: Not tested   LUMBAR ROM:    AROM eval  Flexion 75%  Extension 75%  Right lateral flexion 90%  Left lateral flexion 90%  Right rotation 50%  Left rotation 25%   (Blank rows = not tested)   LOWER EXTREMITY ROM:      Active  Right eval Left eval  Hip flexion [facility]  Hip extension restricted restricted  Hip abduction      Hip adduction      Hip internal rotation restricted restricted  Hip external rotation restricted restricted  Knee flexion [facility] WFL  Knee extension [facility]  Ankle dorsiflexion      Ankle plantarflexion      Ankle inversion      Ankle eversion       (Blank rows = not tested)   LOWER EXTREMITY MMT:     MMT Right eval Left eval  Hip flexion      Hip extension      Hip abduction      Hip adduction      Hip internal rotation       Hip external rotation      Knee flexion      Knee extension      Ankle dorsiflexion      Ankle plantarflexion      Ankle inversion      Ankle eversion       (Blank rows = not tested)   LUMBAR SPECIAL TESTS:  Straight leg raise test: Negative, Slump test: Negative, FABER test: Positive, and Thomas test: Positive   FUNCTIONAL TESTS:  5 times sit to stand: 29s arms crossed   GAIT: Distance walked: 57f x2 Assistive device utilized: None Level of assistance: Complete Independence Comments: slow cadence with decreased trunk rotation       TODAY'S TREATMENT:  [facility] Adult PT Treatment:                                                DATE: 03/04/22 Therapeutic Exercise: Nustep L2 6 min SKTC 30s x2 B Bridge 15x w/ball squeeze DKTC with ball squeeze 15x Supine march 15/15 with BUEs in flexion Supine flexion w/dowel focus on inspiration 15x 90/90 hips/knees 30s x2 Open book 10/10 Supine Hor Abd YTB 15x Standing ball roll flexion on wall 10x Seated thoracic extension over 1/2 roll 10x Seated thoracic rotation against vertical 1/2 roll 10x B   [facility] Adult PT Treatment:  DATE: 02/25/22 Therapeutic Exercise: UBE L1 5 min SKTC 30s x2 B Bridge 15x Supine march 15/15 Supine flexion w/dowel focus on inspiration 15x 90/90 hips/knees  Open book 10/10 Supine Hor Abd YTB 15x Standing ball roll flexion on wall 10x    PATIENT EDUCATION:  Education details: Discussed eval findings, rehab rationale and POC and patient is in agreement  Person educated: Patient Education method: Explanation Education comprehension: verbalized understanding and needs further education     HOME EXERCISE PROGRAM: Access Code: [email or web address] URL: [email or web address] Date: 02/03/2022 Prepared by: Sharlynn Oliphant   Exercises - Hooklying Single Knee to Chest Stretch  - 1 x daily - 5 x weekly - 3 sets - 3 reps - 30s hold - Supine Bridge  - 1 x daily -  5 x weekly - 1 sets - 10 reps - Supine Shoulder Flexion Extension AAROM with Dowel  - 1 x daily - 5 x weekly - 1 sets - 15 reps   ASSESSMENT:   CLINICAL IMPRESSION: Today's session continued stretching of thoracic spine and myofascial tissues, abdominal and core strengthening, emphasized thoracic mobility through inspiration tasks.  Added additional self mobilizations into thoracic extension and rotation.  AROM increased as repetitions progressed during self mobilization tasks      OBJECTIVE IMPAIRMENTS: Abnormal gait, decreased activity tolerance, decreased knowledge of condition, decreased mobility, increased fascial restrictions, impaired flexibility, impaired UE functional use, improper body mechanics, postural dysfunction, and pain.    ACTIVITY LIMITATIONS: carrying, lifting, bending, sitting, and reach over head   PERSONAL FACTORS: Age and Time since onset of injury/illness/exacerbation are also affecting patient's functional outcome.    REHAB POTENTIAL: Good   CLINICAL DECISION MAKING: Stable/uncomplicated   EVALUATION COMPLEXITY: Low     GOALS: Goals reviewed with patient? Yes   SHORT TERM GOALS=LONG TERM GOALS: Target date: 03/03/2022   Patient to demonstrate independence in HEP  Baseline: [id number] Goal status: Partially compliant   2.  Decrease 5x STS time to 20s arms crossed Baseline: 29s arms crossed Goal status: INITIAL   3.  Decrease worst pain to 6/10 Baseline: 8/10 Goal status: INITIAL   4.  Increase FOTO score to 59 Baseline: 45 Goal status: INITIAL      PLAN: PT FREQUENCY: 1-2x/week   PT DURATION: 4 weeks   PLANNED INTERVENTIONS: Therapeutic exercises, Therapeutic activity, Neuromuscular re-education, Balance training, Gait training, Patient/Family education, Self Care, Joint mobilization, Dry Needling, Spinal mobilization, Manual therapy, and Re-evaluation.   PLAN FOR NEXT SESSION: HEP review and update, aerobic tasks, posture and body mechanic  training, thoracic strengthening and mobilization, assess for possible DC    Lanice Shirts, PT 03/04/2022, 3:00 PM

## 2022-03-09 ENCOUNTER — Ambulatory Visit: Payer: Medicare Other

## 2022-03-09 DIAGNOSIS — S29019A Strain of muscle and tendon of unspecified wall of thorax, initial encounter: Secondary | ICD-10-CM | POA: Diagnosis not present

## 2022-03-09 DIAGNOSIS — M6281 Muscle weakness (generalized): Secondary | ICD-10-CM

## 2022-03-09 NOTE — Therapy (Signed)
OUTPATIENT PHYSICAL THERAPY TREATMENT NOTE   Patient Name: Deborah Camacho MRN: [id number] DOB:1956/03/13, 66 y.o., female Today's Date: 03/09/2022  PCP: Harvie Junior, MD   REFERRING PROVIDER: Vallarie Mare, MD   END OF SESSION:   PT End of Session - 03/09/22 1450     Visit Number 4    Number of Visits 4    Date for PT Re-Evaluation 03/31/22    Authorization Type UHC    PT Start Time 9244    PT Stop Time 1525    PT Time Calculation (min) 40 min    Activity Tolerance Patient tolerated treatment well    Behavior During Therapy WFL for tasks assessed/performed             Past Medical History:  Diagnosis Date   Arthritis    Arthritis    both legs   Back pain    Diabetes mellitus    TYPE 2   Dyslipidemia    Hand pain    Hyperlipidemia    Hypertension    Leg pain    Vitamin D deficiency    Past Surgical History:  Procedure Laterality Date   ABDOMINAL HYSTERECTOMY     CARPAL TUNNEL RELEASE Bilateral    ROBOTIC ASSISTED TOTAL HYSTERECTOMY WITH BILATERAL SALPINGO OOPHERECTOMY Bilateral 11/04/2017   Procedure: XI ROBOTIC ASSISTED TOTAL HYSTERECTOMY WITH BILATERAL SALPINGO OOPHORECTOMY;  Surgeon: Everitt Amber, MD;  Location: [facility];  Service: Gynecology;  Laterality: Bilateral;   SENTINEL NODE BIOPSY N/A 11/04/2017   Procedure: SENTINEL LYMPH NODE BIOPSY;  Surgeon: Everitt Amber, MD;  Location: [facility];  Service: Gynecology;  Laterality: N/A;   TUBAL LIGATION  1978   Patient Active Problem List   Diagnosis Date Noted   Complex endometrial hyperplasia with atypia 11/04/2017   Endometrial hyperplasia 10/25/2017   Hypertension associated with diabetes ([place]) 12/01/2011   Obesity (BMI 30-39.9) 12/01/2011   Diabetes mellitus ([place]) 10/27/2010   Osteomalacia, vitamin D deficient 10/27/2010   Hyperlipidemia LDL goal <70 10/27/2010   Chronic back pain greater than 3 months duration 10/27/2010    REFERRING DIAG: M54.9 (ICD-10-CM) - Dorsalgia, unspecified     THERAPY DIAG: Thoracic pain  Postural dysfunction   Rationale for Evaluation and Treatment Rehabilitation  PERTINENT HISTORY: None available   PRECAUTIONS: None   SUBJECTIVE:                                                                                                                                                                                      SUBJECTIVE STATEMENT: No pain at start of session but reports 9/10 pain when performing housework tasks.  Symptoms less intense overall.  PAIN:  Are you having pain? Yes: NPRS scale: 0/10 Pain location: thoracic  Pain description: ache Aggravating factors: cleaning and sweeping tasks Relieving factors: rest   OBJECTIVE: (objective measures completed at initial evaluation unless otherwise dated)  DIAGNOSTIC FINDINGS:  None available   PATIENT SURVEYS:  FOTO 45(50 predicted); 03/09/22 54   SCREENING FOR RED FLAGS:            negative   COGNITION:           Overall cognitive status: Within functional limits for tasks assessed                          SENSATION: WFL   MUSCLE LENGTH: Hamstrings: Right 75 deg; Left 80 deg Thomas test: restricted B   POSTURE: rounded shoulders and increased lumbar lordosis   PALPATION: Not tested   LUMBAR ROM:    AROM eval 03/09/22  Flexion 75% 90%  Extension 75% 90%  Right lateral flexion 90% 90%  Left lateral flexion 90% 90%  Right rotation 50% WFL  Left rotation 25% WFL   (Blank rows = not tested)   LOWER EXTREMITY ROM:      Active  Right eval Left eval  Hip flexion [facility]  Hip extension restricted restricted  Hip abduction      Hip adduction      Hip internal rotation restricted restricted  Hip external rotation restricted restricted  Knee flexion [facility] WFL  Knee extension [facility]  Ankle dorsiflexion      Ankle plantarflexion      Ankle inversion      Ankle eversion       (Blank rows = not tested)   LOWER EXTREMITY MMT:     MMT Right eval Left eval   Hip flexion      Hip extension      Hip abduction      Hip adduction      Hip internal rotation      Hip external rotation      Knee flexion      Knee extension      Ankle dorsiflexion      Ankle plantarflexion      Ankle inversion      Ankle eversion       (Blank rows = not tested)   LUMBAR SPECIAL TESTS:  Straight leg raise test: Negative, Slump test: Negative, FABER test: Positive, and Thomas test: Positive   FUNCTIONAL TESTS:  5 times sit to stand: 29s arms crossed   GAIT: Distance walked: 74f x2 Assistive device utilized: None Level of assistance: Complete Independence Comments: slow cadence with decreased trunk rotation       TODAY'S TREATMENT:  [facility] Adult PT Treatment:                                                DATE: 03/09/22 Therapeutic Exercise: Nustep L2 6 min Open book 10/10 Supine alternating shoulder flexion 15/15 Supine alternating march 15/15 Prone prop 1 min ROM and FOTO assessment  [facility] Adult PT Treatment:                                                DATE:  03/04/22 Therapeutic Exercise: Nustep L2 6 min SKTC 30s x2 B Bridge 15x w/ball squeeze DKTC with ball squeeze 15x Supine march 15/15 with BUEs in flexion Supine flexion w/dowel focus on inspiration 15x 90/90 hips/knees 30s x2 Open book 10/10 Supine Hor Abd YTB 15x Standing ball roll flexion on wall 10x Seated thoracic extension over 1/2 roll 10x Seated thoracic rotation against vertical 1/2 roll 10x B   [facility] Adult PT Treatment:                                                DATE: 02/25/22 Therapeutic Exercise: UBE L1 5 min SKTC 30s x2 B Bridge 15x Supine march 15/15 Supine flexion w/dowel focus on inspiration 15x 90/90 hips/knees  Open book 10/10 Supine Hor Abd YTB 15x Standing ball roll flexion on wall 10x    PATIENT EDUCATION:  Education details: Discussed eval findings, rehab rationale and POC and patient is in agreement  Person educated: Patient Education method:  Explanation Education comprehension: verbalized understanding and needs further education     HOME EXERCISE PROGRAM: Access Code: [email or web address] URL: [email or web address] Date: 02/03/2022 Prepared by: Sharlynn Oliphant   Exercises - Hooklying Single Knee to Chest Stretch  - 1 x daily - 5 x weekly - 3 sets - 3 reps - 30s hold - Supine Bridge  - 1 x daily - 5 x weekly - 1 sets - 10 reps - Supine Shoulder Flexion Extension AAROM with Dowel  - 1 x daily - 5 x weekly - 1 sets - 15 reps   ASSESSMENT:   CLINICAL IMPRESSION: Patient has regained functional trunk ROM but still shows flexibility and strength deficits.  She reports gains overall and is receptive to continue PT due to gains made so far. Goals assessed and progress noted.  Recommend continue OPPT 1w4 to address remaining unmet goals.     OBJECTIVE IMPAIRMENTS: Abnormal gait, decreased activity tolerance, decreased knowledge of condition, decreased mobility, increased fascial restrictions, impaired flexibility, impaired UE functional use, improper body mechanics, postural dysfunction, and pain.    ACTIVITY LIMITATIONS: carrying, lifting, bending, sitting, and reach over head   PERSONAL FACTORS: Age and Time since onset of injury/illness/exacerbation are also affecting patient's functional outcome.    REHAB POTENTIAL: Good   CLINICAL DECISION MAKING: Stable/uncomplicated   EVALUATION COMPLEXITY: Low     GOALS: Goals reviewed with patient? Yes   SHORT TERM GOALS=LONG TERM GOALS: Target date: 03/03/2022   Patient to demonstrate independence in HEP  Baseline: [id number] Goal status: Met   2.  Decrease 5x STS time to 20s arms crossed Baseline: 29s arms crossed; 03/09/22 16s arms crossed Goal status: Met   3.  Decrease worst pain to 6/10 Baseline: 8/10; 03/09/22 8/10 Goal status: Partially met   4.  Increase FOTO score to 59 Baseline: 49; 11/20 54 Goal status: Partially met      PLAN: PT FREQUENCY:  1-2x/week   PT DURATION: 4 weeks   PLANNED INTERVENTIONS: Therapeutic exercises, Therapeutic activity, Neuromuscular re-education, Balance training, Gait training, Patient/Family education, Self Care, Joint mobilization, Dry Needling, Spinal mobilization, Manual therapy, and Re-evaluation.   PLAN FOR NEXT SESSION: HEP review and update, aerobic tasks, posture and body mechanic training, thoracic strengthening and mobilization, assess for possible DC    Lanice Shirts, PT 03/09/2022, 3:33 PM

## 2022-03-10 ENCOUNTER — Ambulatory Visit: Payer: Medicare Other | Admitting: Podiatry

## 2022-03-20 ENCOUNTER — Ambulatory Visit: Payer: Medicare Other

## 2022-03-25 ENCOUNTER — Ambulatory Visit: Payer: Medicare Other | Attending: Neurosurgery

## 2022-03-25 DIAGNOSIS — S29019A Strain of muscle and tendon of unspecified wall of thorax, initial encounter: Secondary | ICD-10-CM | POA: Insufficient documentation

## 2022-03-25 DIAGNOSIS — M6281 Muscle weakness (generalized): Secondary | ICD-10-CM | POA: Diagnosis present

## 2022-03-25 NOTE — Therapy (Signed)
OUTPATIENT PHYSICAL THERAPY TREATMENT NOTE/DC SUMMARY   Patient Name: Deborah Camacho MRN: [id number] DOB:[birth year], 66 y.o., female Today's Date: 03/25/2022  PCP: Harvie Junior, MD   REFERRING PROVIDER: Vallarie Mare, MD  PHYSICAL THERAPY DISCHARGE SUMMARY  Visits from Start of Care: 5  Current functional level related to goals / functional outcomes: Goals partially met   Remaining deficits: Weakness and pain   Education / Equipment: HEP   Patient agrees to discharge. Patient goals were partially met. Patient is being discharged due to being pleased with the current functional level.  END OF SESSION:   PT End of Session - 03/25/22 1614     Visit Number 5    Number of Visits 4    Date for PT Re-Evaluation 03/31/22    Authorization Type [employer]    PT Start Time 1615    PT Stop Time 1655    PT Time Calculation (min) 40 min    Activity Tolerance Patient tolerated treatment well    Behavior During Therapy WFL for tasks assessed/performed             Past Medical History:  Diagnosis Date   Arthritis    Arthritis    both legs   Back pain    Diabetes mellitus    TYPE 2   Dyslipidemia    Hand pain    Hyperlipidemia    Hypertension    Leg pain    Vitamin D deficiency    Past Surgical History:  Procedure Laterality Date   ABDOMINAL HYSTERECTOMY     CARPAL TUNNEL RELEASE Bilateral    ROBOTIC ASSISTED TOTAL HYSTERECTOMY WITH BILATERAL SALPINGO OOPHERECTOMY Bilateral 11/04/2017   Procedure: XI ROBOTIC ASSISTED TOTAL HYSTERECTOMY WITH BILATERAL SALPINGO OOPHORECTOMY;  Surgeon: Everitt Amber, MD;  Location: [facility];  Service: Gynecology;  Laterality: Bilateral;   SENTINEL NODE BIOPSY N/A 11/04/2017   Procedure: SENTINEL LYMPH NODE BIOPSY;  Surgeon: Everitt Amber, MD;  Location: [facility];  Service: Gynecology;  Laterality: N/A;   TUBAL LIGATION  1978   Patient Active Problem List   Diagnosis Date Noted   Complex endometrial hyperplasia with atypia 11/04/2017    Endometrial hyperplasia 10/25/2017   Hypertension associated with diabetes ([place]) 12/01/2011   Obesity (BMI 30-39.9) 12/01/2011   Diabetes mellitus ([place]) 10/27/2010   Osteomalacia, vitamin D deficient 10/27/2010   Hyperlipidemia LDL goal <70 10/27/2010   Chronic back pain greater than 3 months duration 10/27/2010    REFERRING DIAG: M54.9 (ICD-10-CM) - Dorsalgia, unspecified    THERAPY DIAG: Thoracic pain  Postural dysfunction   Rationale for Evaluation and Treatment Rehabilitation  PERTINENT HISTORY: None available   PRECAUTIONS: None   SUBJECTIVE:  SUBJECTIVE STATEMENT: Reports "things are better".  Pain onsets following 5 minutes of house cleaning tasks and resolves with 5 minutes of rest.  Feels ready for independent management at this time.   PAIN:  Are you having pain? Yes: NPRS scale: 8/10 Pain location: thoracic  Pain description: ache Aggravating factors: cleaning and sweeping tasks Relieving factors: rest   OBJECTIVE: (objective measures completed at initial evaluation unless otherwise dated)  DIAGNOSTIC FINDINGS:  None available   PATIENT SURVEYS:  FOTO 45(50 predicted); 03/09/22 54; 03/25/22 54   SCREENING FOR RED FLAGS:            negative   COGNITION:           Overall cognitive status: Within functional limits for tasks assessed                          SENSATION: WFL   MUSCLE LENGTH: Hamstrings: Right 75 deg; Left 80 deg Thomas test: restricted B   POSTURE: rounded shoulders and increased lumbar lordosis   PALPATION: Not tested   LUMBAR ROM:    AROM eval 03/09/22  Flexion 75% 90%  Extension 75% 90%  Right lateral flexion 90% 90%  Left lateral flexion 90% 90%  Right rotation 50% WFL  Left rotation 25% WFL   (Blank rows = not tested)   LOWER EXTREMITY ROM:       Active  Right eval Left eval  Hip flexion [facility]  Hip extension restricted restricted  Hip abduction      Hip adduction      Hip internal rotation restricted restricted  Hip external rotation restricted restricted  Knee flexion [facility] WFL  Knee extension [facility]  Ankle dorsiflexion      Ankle plantarflexion      Ankle inversion      Ankle eversion       (Blank rows = not tested)   LOWER EXTREMITY MMT:     MMT Right eval Left eval  Hip flexion      Hip extension      Hip abduction      Hip adduction      Hip internal rotation      Hip external rotation      Knee flexion      Knee extension      Ankle dorsiflexion      Ankle plantarflexion      Ankle inversion      Ankle eversion       (Blank rows = not tested)   LUMBAR SPECIAL TESTS:  Straight leg raise test: Negative, Slump test: Negative, FABER test: Positive, and Thomas test: Positive   FUNCTIONAL TESTS:  5 times sit to stand: 29s arms crossed   GAIT: Distance walked: 87f x2 Assistive device utilized: None Level of assistance: Complete Independence Comments: slow cadence with decreased trunk rotation       TODAY'S TREATMENT:  [facility] Adult PT Treatment:                                                DATE: 03/25/22 Therapeutic Exercise: Nustep L2 8 min Sidelie open book 10x B Supine alternating shoulder flexion 15/15 Supine alternating march 15/15 90/90 knees/hips 30 sx2 ROM and FOTO assessment  [facility] Adult PT Treatment:  DATE: 03/25/22 Therapeutic Exercise: Nustep L2 6 min Open book 10/10 Supine alternating shoulder flexion 15/15 Supine alternating march 15/15 Prone prop 1 min ROM and FOTO assessment  [facility] Adult PT Treatment:                                                DATE: 03/09/22 Therapeutic Exercise: Nustep L2 6 min Open book 10/10 Supine alternating shoulder flexion 15/15 Supine alternating march 15/15 Prone prop 1 min ROM and FOTO  assessment  [facility] Adult PT Treatment:                                                DATE: 03/04/22 Therapeutic Exercise: Nustep L2 6 min SKTC 30s x2 B Bridge 15x w/ball squeeze DKTC with ball squeeze 15x Supine march 15/15 with BUEs in flexion Supine flexion w/dowel focus on inspiration 15x 90/90 hips/knees 30s x2 Open book 10/10 Supine Hor Abd YTB 15x Standing ball roll flexion on wall 10x Seated thoracic extension over 1/2 roll 10x Seated thoracic rotation against vertical 1/2 roll 10x B   [facility] Adult PT Treatment:                                                DATE: 02/25/22 Therapeutic Exercise: UBE L1 5 min SKTC 30s x2 B Bridge 15x Supine march 15/15 Supine flexion w/dowel focus on inspiration 15x 90/90 hips/knees  Open book 10/10 Supine Hor Abd YTB 15x Standing ball roll flexion on wall 10x    PATIENT EDUCATION:  Education details: Discussed eval findings, rehab rationale and POC and patient is in agreement  Person educated: Patient Education method: Explanation Education comprehension: verbalized understanding and needs further education     HOME EXERCISE PROGRAM: Access Code: [email or web address] URL: [email or web address] Date: 02/03/2022 Prepared by: Sharlynn Oliphant   Exercises - Hooklying Single Knee to Chest Stretch  - 1 x daily - 5 x weekly - 3 sets - 3 reps - 30s hold - Supine Bridge  - 1 x daily - 5 x weekly - 1 sets - 10 reps - Supine Shoulder Flexion Extension AAROM with Dowel  - 1 x daily - 5 x weekly - 1 sets - 15 reps   ASSESSMENT:   CLINICAL IMPRESSION: Goals partially met, patient has regained maximum benefit from OPPT at this time. AROM with open book has improved, FOTO score is stagnant.  Patient able to demo HEP back to PT      OBJECTIVE IMPAIRMENTS: Abnormal gait, decreased activity tolerance, decreased knowledge of condition, decreased mobility, increased fascial restrictions, impaired flexibility, impaired UE functional use, improper  body mechanics, postural dysfunction, and pain.    ACTIVITY LIMITATIONS: carrying, lifting, bending, sitting, and reach over head   PERSONAL FACTORS: Age and Time since onset of injury/illness/exacerbation are also affecting patient's functional outcome.    REHAB POTENTIAL: Good   CLINICAL DECISION MAKING: Stable/uncomplicated   EVALUATION COMPLEXITY: Low     GOALS: Goals reviewed with patient? Yes   SHORT TERM GOALS=LONG TERM GOALS: Target date: 03/03/2022   Patient to demonstrate independence in HEP  Baseline: [id number]  Goal status: Met   2.  Decrease 5x STS time to 20s arms crossed Baseline: 29s arms crossed; 03/09/22 16s arms crossed Goal status: Met   3.  Decrease worst pain to 6/10 Baseline: 8/10; 03/09/22 8/10; 03/25/22 8/10 Goal status: Partially met   4.  Increase FOTO score to 59 Baseline: 49; 11/20 54 Goal status: Partially met      PLAN: PT FREQUENCY: 1-2x/week   PT DURATION: 4 weeks   PLANNED INTERVENTIONS: Therapeutic exercises, Therapeutic activity, Neuromuscular re-education, Balance training, Gait training, Patient/Family education, Self Care, Joint mobilization, Dry Needling, Spinal mobilization, Manual therapy, and Re-evaluation.   PLAN FOR NEXT SESSION: DC to HEP    Lanice Shirts, PT 03/25/2022, 4:56 PM

## 2022-03-30 ENCOUNTER — Ambulatory Visit
Admission: RE | Admit: 2022-03-30 | Discharge: 2022-03-30 | Disposition: A | Discharge status: Home / Self Care | Payer: Medicare Other | Source: Ambulatory Visit | Attending: Specialist | Admitting: Specialist

## 2022-03-30 DIAGNOSIS — Z1231 Encounter for screening mammogram for malignant neoplasm of breast: Secondary | ICD-10-CM

## 2022-04-01 ENCOUNTER — Ambulatory Visit: Payer: Medicare Other

## 2022-04-06 ENCOUNTER — Ambulatory Visit (INDEPENDENT_AMBULATORY_CARE_PROVIDER_SITE_OTHER): Payer: Medicare Other | Admitting: Podiatry

## 2022-04-06 VITALS — BP 123/75

## 2022-04-06 DIAGNOSIS — M79674 Pain in right toe(s): Secondary | ICD-10-CM

## 2022-04-06 DIAGNOSIS — E1149 Type 2 diabetes mellitus with other diabetic neurological complication: Secondary | ICD-10-CM

## 2022-04-06 DIAGNOSIS — B353 Tinea pedis: Secondary | ICD-10-CM | POA: Diagnosis not present

## 2022-04-06 DIAGNOSIS — L84 Corns and callosities: Secondary | ICD-10-CM

## 2022-04-06 DIAGNOSIS — E1140 Type 2 diabetes mellitus with diabetic neuropathy, unspecified: Secondary | ICD-10-CM

## 2022-04-06 DIAGNOSIS — M79675 Pain in left toe(s): Secondary | ICD-10-CM

## 2022-04-06 DIAGNOSIS — B351 Tinea unguium: Secondary | ICD-10-CM | POA: Diagnosis not present

## 2022-04-06 MED ORDER — KETOCONAZOLE 2 % EX CREA: TOPICAL_CREAM | CUTANEOUS | 1 refills | Status: DC

## 2022-04-06 NOTE — Progress Notes (Unsigned)
keto  Subjective:  Patient ID: Deborah Camacho, female    DOB: [birth year],  MRN: [id number]  Deborah Camacho presents to clinic today for {jgcomplaint:23593}  Chief Complaint  Patient presents with   Nail Problem    [facility] BS-did not check today A1C-7.5 PCP-Williams,Dwight PCP VST-03/27/2022   New problem(s): None. {jgcomplaint:23593}  PCP is Harvie Junior, MD.  No Known Allergies  Review of Systems: Negative except as noted in the HPI.  Objective: No changes noted in today's physical examination. Vitals:   04/06/22 1513  BP: 123/75   Deborah Camacho is a pleasant 66 y.o. female {jgbodyhabitus:24098} AAO x 3.  Vascular Examination: CFT immediate b/l LE. Palpable DP/PT pulses b/l LE. Digital hair present b/l. Skin temperature gradient WNL b/l. No pain with calf compression b/l. No edema noted b/l. No cyanosis or clubbing noted b/l LE.  Dermatological Examination: Pedal integument with normal turgor, texture and tone b/l LE. No open wounds b/l. No interdigital macerations b/l. Toenails 1-5 b/l elongated, thickened, discolored with subungual debris. +Tenderness with dorsal palpation of nailplates. No hyperkeratotic or porokeratotic lesions present.  Neurological Examination: Pt has subjective symptoms of neuropathy. Protective sensation decreased with 10 gram monofilament b/l.  Musculoskeletal Examination: Normal muscle strength 5/5 to all lower extremity muscle groups bilaterally. No pain, crepitus or joint limitation noted with ROM b/l LE. No gross bony pedal deformities b/l. Patient ambulates independently without assistive aids.  Assessment/Plan: 1. Pain due to onychomycosis of toenails of both feet   2. Callus   3. Diabetic neuropathy with neurologic complication ([place])   4. Tinea pedis of both feet     Meds ordered this encounter  Medications   ketoconazole (NIZORAL) 2 % cream    Sig: Apply to both feet and between toes once daily for 6 weeks.    Dispense:  60 g     Refill:  1    {Jgplan:23602::"-Patient/POA to call should there be question/concern in the interim."}   Return in about 3 months (around 07/06/2022).  Marzetta Board, DPM

## 2022-04-08 ENCOUNTER — Encounter: Payer: Self-pay | Admitting: Podiatry

## 2022-04-22 ENCOUNTER — Ambulatory Visit: Payer: Medicare Other | Admitting: Podiatry

## 2022-05-01 ENCOUNTER — Other Ambulatory Visit: Payer: Self-pay | Admitting: Specialist

## 2022-05-01 DIAGNOSIS — Z1231 Encounter for screening mammogram for malignant neoplasm of breast: Secondary | ICD-10-CM

## 2022-05-06 ENCOUNTER — Other Ambulatory Visit: Payer: Self-pay | Admitting: Podiatry

## 2022-05-06 DIAGNOSIS — B353 Tinea pedis: Secondary | ICD-10-CM

## 2022-06-30 ENCOUNTER — Other Ambulatory Visit: Payer: Self-pay | Admitting: Podiatry

## 2022-06-30 DIAGNOSIS — B353 Tinea pedis: Secondary | ICD-10-CM

## 2022-07-27 ENCOUNTER — Ambulatory Visit (INDEPENDENT_AMBULATORY_CARE_PROVIDER_SITE_OTHER): Payer: Medicare HMO | Admitting: Podiatry

## 2022-07-27 VITALS — BP 148/90 | HR 95

## 2022-07-27 DIAGNOSIS — B351 Tinea unguium: Secondary | ICD-10-CM | POA: Diagnosis not present

## 2022-07-27 DIAGNOSIS — B353 Tinea pedis: Secondary | ICD-10-CM

## 2022-07-27 DIAGNOSIS — E1149 Type 2 diabetes mellitus with other diabetic neurological complication: Secondary | ICD-10-CM

## 2022-07-27 DIAGNOSIS — E1140 Type 2 diabetes mellitus with diabetic neuropathy, unspecified: Secondary | ICD-10-CM

## 2022-07-27 DIAGNOSIS — M79674 Pain in right toe(s): Secondary | ICD-10-CM

## 2022-07-27 DIAGNOSIS — M79675 Pain in left toe(s): Secondary | ICD-10-CM

## 2022-07-27 MED ORDER — KETOCONAZOLE 2 % EX CREA: TOPICAL_CREAM | CUTANEOUS | 1 refills | Status: DC

## 2022-07-27 NOTE — Progress Notes (Signed)
  Subjective:  Patient ID: Deborah Camacho, female    DOB: 1955/05/27,  MRN: [id number]  Deborah Camacho presents to clinic today for at risk foot care with history of diabetic neuropathy and thick, elongated toenails both feet which are tender when wearing enclosed shoe gear.  Chief Complaint  Patient presents with   Diabetes    [facility] BS - HAVEN'T CHECKED IT METER IS BROKE A1C - 7.5  LVPCP - 06/2022   Patient has persistent scaling of both feet. Had improved with Ketoconazole, but returned.  PCP is Jearld Lesch, MD.  No Known Allergies  Review of Systems: Negative except as noted in the HPI.  Objective: No changes noted in today's physical examination. Vitals:   07/27/22 1524  BP: (!) 148/90  Pulse: 95   Deborah Camacho is a pleasant 67 y.o. female WD, WN in NAD. AAO x 3.  Vascular Examination: CFT immediate b/l LE. Palpable DP/PT pulses b/l LE. Digital hair present b/l. Skin temperature gradient WNL b/l. No pain with calf compression b/l. No edema noted b/l. No cyanosis or clubbing noted b/l LE.  Dermatological Examination: Pedal integument with normal turgor, texture and tone b/l LE. No open wounds b/l. No interdigital macerations b/l. Toenails 1-5 b/l elongated, thickened, discolored with subungual debris. +Tenderness with dorsal palpation of nailplates. No hyperkeratotic or porokeratotic lesions present.  Improved diffuse scaling noted peripherally and plantarly b/l feet.  No interdigital macerations.  No blisters, no weeping. No signs of secondary bacterial infection noted.  Neurological Examination: Pt has subjective symptoms of neuropathy. Protective sensation decreased with 10 gram monofilament b/l.  Musculoskeletal Examination: Normal muscle strength 5/5 to all lower extremity muscle groups bilaterally. No pain, crepitus or joint limitation noted with ROM b/l LE. No gross bony pedal deformities b/l. Patient ambulates independently without assistive  aids.  Assessment/Plan: 1. Pain due to onychomycosis of toenails of both feet   2. Tinea pedis of both feet   3. Diabetic neuropathy with neurologic complication     Meds ordered this encounter  Medications   ketoconazole (NIZORAL) 2 % cream    Sig: Apply to both feet and between toes once daily for 6 weeks.    Dispense:  60 g    Refill:  1    This prescription was filled on 06/30/2022. Any refills authorized will be placed on file.    -Patient was evaluated and treated. All patient's and/or POA's questions/concerns answered on today's visit. -Continue supportive shoe gear daily. -Toenails 1-5 b/l were debrided in length and girth with sterile nail nippers and dremel without iatrogenic bleeding.  -For tinea pedis, Rx sent to pharmacy for Ketoconazole Cream 2% to be applied once daily for six weeks. -Patient/POA to call should there be question/concern in the interim.   Return in about 3 months (around 10/26/2022).  Freddie Breech, DPM

## 2022-07-29 ENCOUNTER — Encounter: Payer: Self-pay | Admitting: Gastroenterology

## 2022-08-01 ENCOUNTER — Encounter: Payer: Self-pay | Admitting: Podiatry

## 2022-08-03 ENCOUNTER — Encounter: Payer: Self-pay | Admitting: Gastroenterology

## 2022-08-26 ENCOUNTER — Ambulatory Visit (AMBULATORY_SURGERY_CENTER): Payer: Medicare HMO

## 2022-08-26 VITALS — Ht 63.0 in | Wt 168.0 lb

## 2022-08-26 DIAGNOSIS — Z86010 Personal history of colonic polyps: Secondary | ICD-10-CM

## 2022-08-26 DIAGNOSIS — K5909 Other constipation: Secondary | ICD-10-CM

## 2022-08-26 MED ORDER — PEG 3350-KCL-NA BICARB-NACL 420 G PO SOLR: 4000.0000 mL | Freq: Once | ORAL | 0 refills | Status: AC

## 2022-08-26 NOTE — Progress Notes (Signed)
No egg or soy allergy known to patient  No issues known to pt with past sedation with any surgeries or procedures Patient denies ever being told they had issues or difficulty with intubation  No FH of Malignant Hyperthermia Pt is not on diet pills Pt is not on  home 02  Pt is not on blood thinners  Pt reports constipation report BM 2 time a week with straining instructions given for extra miralax with prep  No A fib or A flutter Have any cardiac testing pending--no  Pt des  Patient's chart reviewed by Cathlyn Parsons CNRA prior to previsit and patient appropriate for the [facility].  Previsit completed and red dot placed by patient's name on their procedure day (on provider's schedule).      PV complete. Prep reviewed with pt. Instructions sent to mailing address . Rx sent to [facility]

## 2022-08-27 ENCOUNTER — Other Ambulatory Visit: Payer: Self-pay | Admitting: Podiatry

## 2022-08-27 DIAGNOSIS — B353 Tinea pedis: Secondary | ICD-10-CM

## 2022-09-10 ENCOUNTER — Encounter: Payer: Medicare Other | Admitting: Gastroenterology

## 2022-09-25 ENCOUNTER — Encounter: Payer: Medicare Other | Admitting: Gastroenterology

## 2022-10-23 ENCOUNTER — Encounter: Payer: Medicare Other | Admitting: Gastroenterology

## 2022-10-23 ENCOUNTER — Telehealth: Payer: Self-pay | Admitting: Gastroenterology

## 2022-10-23 NOTE — Telephone Encounter (Signed)
Unfortunately, this was a wasted time slot.  One chance to reschedule if she calls back.  - HD

## 2022-10-23 NOTE — Telephone Encounter (Signed)
Deborah Camacho tells me she completely forgot all about her procedure this morning. States she will call back to reschedule later.

## 2022-10-27 ENCOUNTER — Other Ambulatory Visit: Payer: Self-pay | Admitting: Podiatry

## 2022-10-27 DIAGNOSIS — B353 Tinea pedis: Secondary | ICD-10-CM

## 2022-10-28 ENCOUNTER — Ambulatory Visit (INDEPENDENT_AMBULATORY_CARE_PROVIDER_SITE_OTHER): Payer: Medicare HMO | Admitting: Plastic Surgery

## 2022-10-28 ENCOUNTER — Encounter: Payer: Self-pay | Admitting: Plastic Surgery

## 2022-10-28 VITALS — BP 119/78 | HR 72 | Ht 63.0 in | Wt 157.0 lb

## 2022-10-28 DIAGNOSIS — E1142 Type 2 diabetes mellitus with diabetic polyneuropathy: Secondary | ICD-10-CM

## 2022-10-28 DIAGNOSIS — N62 Hypertrophy of breast: Secondary | ICD-10-CM

## 2022-10-28 DIAGNOSIS — M542 Cervicalgia: Secondary | ICD-10-CM

## 2022-10-28 DIAGNOSIS — Z6827 Body mass index (BMI) 27.0-27.9, adult: Secondary | ICD-10-CM

## 2022-10-28 DIAGNOSIS — G8929 Other chronic pain: Secondary | ICD-10-CM

## 2022-10-28 DIAGNOSIS — M25511 Pain in right shoulder: Secondary | ICD-10-CM

## 2022-10-28 DIAGNOSIS — M546 Pain in thoracic spine: Secondary | ICD-10-CM | POA: Diagnosis not present

## 2022-10-28 DIAGNOSIS — Z803 Family history of malignant neoplasm of breast: Secondary | ICD-10-CM

## 2022-10-28 DIAGNOSIS — R21 Rash and other nonspecific skin eruption: Secondary | ICD-10-CM

## 2022-10-28 DIAGNOSIS — M25512 Pain in left shoulder: Secondary | ICD-10-CM

## 2022-10-28 NOTE — Progress Notes (Signed)
Patient ID: Deborah Camacho, female    DOB: 1956/04/17, 67 y.o.   MRN: [id number]   Chief Complaint  Patient presents with   Consult         Mammary Hyperplasia: The patient is a 67 y.o. female with a history of mammary hyperplasia for several years.  She has extremely large breasts causing symptoms that include the following: Back pain in the upper and lower back, including neck pain. She pulls or pins her bra straps to provide better lift and relief of the pressure and pain. She notices relief by holding her breast up manually.  Her shoulder straps cause grooves and pain and pressure that requires padding for relief. Pain medication is sometimes required with motrin and tylenol.  Activities that are hindered by enlarged breasts include: exercise and running.  She has tried supportive clothing as well as fitted bras without improvement.  Her breasts are extremely large and fairly symmetric.  She has hyperpigmentation of the inframammary area on both sides.  The sternal to nipple distance on the right is 37 cm and the left is 38 cm.  The IMF distance is 18 cm.  She is 5 feet 3 inches tall and weighs 157 pounds.  The BMI = 27.8 kg/m.  Preoperative bra size = F or H cup. She will be ok with a C/D cup.  The estimated excess breast tissue to be removed at the time of surgery = 450 grams on the left and 450 grams on the right.  Mammogram history: 12/23 negative.  Family history of breast cancer:  aunt.  Tobacco use:  none.   The patient expresses the desire to pursue surgical intervention.  Hysterectomy without any problems.    Review of Systems  Constitutional:  Positive for activity change. Negative for appetite change.  Eyes: Negative.   Respiratory: Negative.  Negative for chest tightness and shortness of breath.   Cardiovascular: Negative.   Gastrointestinal: Negative.   Endocrine: Negative.   Genitourinary: Negative.   Musculoskeletal:  Positive for back pain and neck pain.  Skin:   Positive for rash.  Hematological: Negative.     Past Medical History:  Diagnosis Date   Arthritis    Arthritis    both legs   Back pain    Diabetes mellitus    TYPE 2   Dyslipidemia    Hand pain    Hyperlipidemia    Hypertension    Leg pain    Vitamin D deficiency     Past Surgical History:  Procedure Laterality Date   ABDOMINAL HYSTERECTOMY     CARPAL TUNNEL RELEASE Bilateral    ROBOTIC ASSISTED TOTAL HYSTERECTOMY WITH BILATERAL SALPINGO OOPHERECTOMY Bilateral 11/04/2017   Procedure: XI ROBOTIC ASSISTED TOTAL HYSTERECTOMY WITH BILATERAL SALPINGO OOPHORECTOMY;  Surgeon: Adolphus Birchwood, MD;  Location: [facility];  Service: Gynecology;  Laterality: Bilateral;   SENTINEL NODE BIOPSY N/A 11/04/2017   Procedure: SENTINEL LYMPH NODE BIOPSY;  Surgeon: Adolphus Birchwood, MD;  Location: [facility];  Service: Gynecology;  Laterality: N/A;   TUBAL LIGATION  1978      Current Outpatient Medications:    atorvastatin (LIPITOR) 40 MG tablet, atorvastatin 40 mg tablet, Disp: , Rfl:    Blood Glucose Monitoring Suppl (ACCU-CHEK GUIDE ME) w/Device KIT, 3 (three) times daily. for testing, Disp: , Rfl:    fluconazole (DIFLUCAN) 150 MG tablet, , Disp: , Rfl:    fluticasone (FLONASE) 50 MCG/ACT nasal spray, Place 2 sprays into both nostrils daily. (Patient  not taking: Reported on 09/08/2021), Disp: 16 g, Rfl: 0   gabapentin (NEURONTIN) 100 MG capsule, Take 100 mg by mouth 2 (two) times daily., Disp: , Rfl:    glimepiride (AMARYL) 4 MG tablet, Take 4 mg by mouth at bedtime., Disp: , Rfl: 3   hydrOXYzine (ATARAX) 25 MG tablet, Take 25 mg by mouth 2 (two) times daily as needed., Disp: , Rfl:    INVOKANA 100 MG TABS tablet, Take 100 mg by mouth daily.  (Patient not taking: Reported on 09/08/2021), Disp: , Rfl: 3   JARDIANCE 25 MG TABS tablet, Take 25 mg by mouth daily., Disp: , Rfl:    ketoconazole (NIZORAL) 2 % cream, Apply to both feet and between toes once daily for 6 weeks., Disp: 60 g, Rfl: 1    lisinopril-hydrochlorothiazide (PRINZIDE,ZESTORETIC) 20-25 MG tablet, Take 1 tablet by mouth daily. , Disp: , Rfl:    Multiple Vitamin (MULTIVITAMIN WITH MINERALS) TABS tablet, Take 1 tablet by mouth daily., Disp: , Rfl:    NARCAN 4 MG/0.1ML LIQD nasal spray kit, CALL 911. ADMINISTER A SINGLE SPRAY OF NARCAN IN ONE NOSTRIL. REPEAT EVERY 3 MINUTES AS NEEDED IF NO OR MINIMAL RESPONSE. (Patient not taking: Reported on 09/08/2021), Disp: , Rfl: 0   omeprazole (PRILOSEC) 20 MG capsule, Take 20 mg by mouth every morning. (Patient not taking: Reported on 09/08/2021), Disp: , Rfl:    Oxycodone HCl 10 MG TABS, , Disp: , Rfl:    oxyCODONE-acetaminophen (PERCOCET) 10-325 MG tablet, Take 1 tablet by mouth every 4 (four) hours as needed for pain. (Patient not taking: Reported on 08/26/2022), Disp: 20 tablet, Rfl: 0   rosuvastatin (CRESTOR) 40 MG tablet, , Disp: , Rfl:    temazepam (RESTORIL) 30 MG capsule, , Disp: , Rfl:    traZODone (DESYREL) 50 MG tablet, Take 50 mg by mouth at bedtime as needed. (Patient not taking: Reported on 09/08/2021), Disp: , Rfl:    TRULICITY 3 MG/0.5ML SOPN, Inject 3 mg into the skin once a week. Every Tuesday, Disp: , Rfl:    valsartan-hydrochlorothiazide (DIOVAN-HCT) 160-12.5 MG tablet, Take 1 tablet by mouth daily. (Patient not taking: Reported on 08/26/2022), Disp: , Rfl:    zolpidem (AMBIEN) 10 MG tablet, , Disp: , Rfl:    Objective:   Vitals:   10/28/22 1128  BP: 119/78  Pulse: 72  SpO2: 96%    Physical Exam Vitals and nursing note reviewed.  Constitutional:      Appearance: Normal appearance.  HENT:     Head: Normocephalic and atraumatic.  Cardiovascular:     Rate and Rhythm: Normal rate.     Pulses: Normal pulses.  Pulmonary:     Effort: Pulmonary effort is normal.  Abdominal:     General: There is no distension.     Palpations: Abdomen is soft.     Tenderness: There is no abdominal tenderness.  Musculoskeletal:        General: No swelling or deformity.  Skin:     General: Skin is warm.     Capillary Refill: Capillary refill takes less than 2 seconds.     Coloration: Skin is not jaundiced.     Findings: No bruising.  Neurological:     Mental Status: She is alert and oriented to person, place, and time.  Psychiatric:        Mood and Affect: Mood normal.        Behavior: Behavior normal.        Thought Content: Thought content  normal.        Judgment: Judgment normal.     Assessment & Plan:  Chronic back pain greater than 3 months duration  Type 2 diabetes mellitus with diabetic polyneuropathy, without long-term current use of insulin (HCC)  Chronic right shoulder pain  Symptomatic mammary hypertrophy  The procedure the patient selected and that was best for the patient was discussed. The risk were discussed and include but not limited to the following:  Breast asymmetry, fluid accumulation, firmness of the breast, inability to breast feed, loss of nipple or areola, skin loss, change in skin and nipple sensation, fat necrosis of the breast tissue, bleeding, infection and healing delay.  There are risks of anesthesia and injury to nerves or blood vessels.  Allergic reaction to tape, suture and skin glue are possible.  There will be swelling.  Any of these can lead to the need for revisional surgery which is not included in this surgery.  A breast reduction has potential to interfere with diagnostic procedures in the future.  This procedure is best done when the breast is fully developed.  Changes in the breast will continue to occur over time: pregnancy, weight gain or weigh loss. No guarantees are given for a certain bra or breast size.    Total time: 40 minutes. This includes time spent with the patient during the visit as well as time spent before and after the visit reviewing the chart, documenting the encounter, ordering pertinent studies and literature for the patient.    The patient is a good candidate for bilateral breast reduction with  possible lateral liposuction.  Will go ahead and make the request for preauthorization.  Pictures were obtained of the patient and placed in the chart with the patient's or guardian's permission.   Alena Bills Abed Schar, DO

## 2022-10-30 ENCOUNTER — Telehealth: Payer: Self-pay | Admitting: *Deleted

## 2022-10-30 NOTE — Telephone Encounter (Signed)
Faxed order,demographics,insurance information,and recent office notes to [facility].  Confirmation received and copy scanned into the chart.//AB/CMA 

## 2022-11-24 ENCOUNTER — Telehealth: Payer: Self-pay | Admitting: Plastic Surgery

## 2022-11-24 NOTE — Telephone Encounter (Signed)
Pt called inquiring about the status of her surgery. She was expecting some papers she was supposed to read over and has not received them. Please call her at [phone]

## 2022-12-20 HISTORY — PX: REDUCTION MAMMAPLASTY: SUR839

## 2022-12-22 ENCOUNTER — Other Ambulatory Visit (INDEPENDENT_AMBULATORY_CARE_PROVIDER_SITE_OTHER): Payer: Medicare HMO | Admitting: Podiatry

## 2022-12-22 DIAGNOSIS — B353 Tinea pedis: Secondary | ICD-10-CM

## 2022-12-30 ENCOUNTER — Ambulatory Visit (INDEPENDENT_AMBULATORY_CARE_PROVIDER_SITE_OTHER): Payer: Medicare HMO | Admitting: Physician Assistant

## 2022-12-30 VITALS — BP 169/84 | HR 93

## 2022-12-30 DIAGNOSIS — N62 Hypertrophy of breast: Secondary | ICD-10-CM

## 2022-12-30 MED ORDER — ONDANSETRON 4 MG PO TBDP: 4.0000 mg | ORAL_TABLET | Freq: Three times a day (TID) | ORAL | 0 refills | Status: AC | PRN

## 2022-12-30 MED ORDER — CEPHALEXIN 500 MG PO CAPS: 500.0000 mg | ORAL_CAPSULE | Freq: Four times a day (QID) | ORAL | 0 refills | Status: AC

## 2022-12-30 NOTE — H&P (View-Only) (Signed)
Patient ID: Deborah Camacho, female    DOB: [birth year], 67 y.o.   MRN: [id number]  Chief Complaint  Patient presents with   Pre-op Exam      ICD-10-CM   1. Symptomatic mammary hypertrophy  N62        History of Present Illness: Deborah Camacho is a 67 y.o.  female  with a history of macromastia.  She presents for preoperative evaluation for upcoming procedure, bilateral breast reduction with possible liposuction, scheduled for 01/14/2023 with Dr. Ulice Bold.  The patient has not had problems with anesthesia.  Previous hysterectomy and right shoulder surgery without complication.  Her BP is elevated today 169/84, but she reports that it is typically well-controlled at home on her multiple antihypertensives.  She is also on 3 separate diabetic medications, but states that she is doing quite well.  Her A1c at time of consult is reportedly 8.3%, but fortunately she had it rechecked earlier this week and was noted to be 6.5%.  Will want to hold her Jardiance and her Trulicity injectable 1 week prior to surgery.  Will send surgical clearance to her PCP.  She endorses a family history of thrombosis (mother), but no personal history.  She denies any significant pulmonary or cardiac disease (aside from her HTN), varicosities, cancer, or nicotine use.  Discussed risks of surgery, including infection and wound healing concerns.  We also discussed possibility for nipple areolar necrosis and/or pivoting to words free nipple graft/amputation technique if needed intraoperatively.  She is understanding and agreeable.  Summary of Previous Visit: She was seen for consult 10/28/2022.  At that time, complained of chronic upper back and neck discomfort in the context of large breasts.  STN 37 cm on the right, 38 cm on the left.  BMI equals 28.7 kg/m.  Preoperative bra size equals F or H cup.  She expressed that she would be okay with a C/D cup.  Estimated excess breast tissue to be removed at time of surgery was  450 g each side.  Mammogram 03/2022 was negative.  Job: N/A.    PMH Significant for: Macromastia, T2DM, HTN, HLD, chronic pain syndrome on oxycodone 10 mg 4 times daily recently filled 12/22/2022 by Dr. Mayford Knife.    Past Medical History: Allergies: No Known Allergies  Current Medications:  Current Outpatient Medications:    atorvastatin (LIPITOR) 40 MG tablet, atorvastatin 40 mg tablet, Disp: , Rfl:    Blood Glucose Monitoring Suppl (ACCU-CHEK GUIDE ME) w/Device KIT, 3 (three) times daily. for testing, Disp: , Rfl:    fluconazole (DIFLUCAN) 150 MG tablet, , Disp: , Rfl:    fluticasone (FLONASE) 50 MCG/ACT nasal spray, Place 2 sprays into both nostrils daily., Disp: 16 g, Rfl: 0   gabapentin (NEURONTIN) 100 MG capsule, Take 100 mg by mouth 2 (two) times daily., Disp: , Rfl:    glimepiride (AMARYL) 4 MG tablet, Take 4 mg by mouth at bedtime., Disp: , Rfl: 3   hydrOXYzine (ATARAX) 25 MG tablet, Take 25 mg by mouth 2 (two) times daily as needed., Disp: , Rfl:    INVOKANA 100 MG TABS tablet, Take 100 mg by mouth daily., Disp: , Rfl: 3   JARDIANCE 25 MG TABS tablet, Take 25 mg by mouth daily., Disp: , Rfl:    ketoconazole (NIZORAL) 2 % cream, Apply to both feet and between toes once daily for 6 weeks., Disp: 60 g, Rfl: 1   lisinopril-hydrochlorothiazide (PRINZIDE,ZESTORETIC) 20-25 MG tablet, Take 1 tablet by mouth  daily., Disp: , Rfl:    Multiple Vitamin (MULTIVITAMIN WITH MINERALS) TABS tablet, Take 1 tablet by mouth daily., Disp: , Rfl:    NARCAN 4 MG/0.1ML LIQD nasal spray kit, , Disp: , Rfl: 0   omeprazole (PRILOSEC) 20 MG capsule, Take 20 mg by mouth every morning., Disp: , Rfl:    Oxycodone HCl 10 MG TABS, , Disp: , Rfl:    oxyCODONE-acetaminophen (PERCOCET) 10-325 MG tablet, Take 1 tablet by mouth every 4 (four) hours as needed for pain., Disp: 20 tablet, Rfl: 0   rosuvastatin (CRESTOR) 40 MG tablet, , Disp: , Rfl:    temazepam (RESTORIL) 30 MG capsule, , Disp: , Rfl:    traZODone  (DESYREL) 50 MG tablet, Take 50 mg by mouth at bedtime as needed., Disp: , Rfl:    TRULICITY 3 MG/0.5ML SOPN, Inject 3 mg into the skin once a week. Every Tuesday, Disp: , Rfl:    valsartan-hydrochlorothiazide (DIOVAN-HCT) 160-12.5 MG tablet, Take 1 tablet by mouth daily., Disp: , Rfl:    zolpidem (AMBIEN) 10 MG tablet, , Disp: , Rfl:   Past Medical Problems: Past Medical History:  Diagnosis Date   Arthritis    Arthritis    both legs   Back pain    Diabetes mellitus    TYPE 2   Dyslipidemia    Hand pain    Hyperlipidemia    Hypertension    Leg pain    Vitamin D deficiency     Past Surgical History: Past Surgical History:  Procedure Laterality Date   ABDOMINAL HYSTERECTOMY     CARPAL TUNNEL RELEASE Bilateral    ROBOTIC ASSISTED TOTAL HYSTERECTOMY WITH BILATERAL SALPINGO OOPHERECTOMY Bilateral 11/04/2017   Procedure: XI ROBOTIC ASSISTED TOTAL HYSTERECTOMY WITH BILATERAL SALPINGO OOPHORECTOMY;  Surgeon: Adolphus Birchwood, MD;  Location: [facility];  Service: Gynecology;  Laterality: Bilateral;   SENTINEL NODE BIOPSY N/A 11/04/2017   Procedure: SENTINEL LYMPH NODE BIOPSY;  Surgeon: Adolphus Birchwood, MD;  Location: [facility];  Service: Gynecology;  Laterality: N/A;   TUBAL LIGATION  1978    Social History: Social History   Socioeconomic History   Marital status: Legally Separated    Spouse name: Not on file   Number of children: Not on file   Years of education: Not on file   Highest education level: Not on file  Occupational History   Not on file  Tobacco Use   Smoking status: Never   Smokeless tobacco: Never  Vaping Use   Vaping status: Never Used  Substance and Sexual Activity   Alcohol use: No   Drug use: Never   Sexual activity: Not on file  Other Topics Concern   Not on file  Social History Narrative   Social History      Diet?        Do you drink/eat things with caffeine? yes      Marital status?                                    What year were you married?      Do you  live in a house, apartment, assisted living, condo, trailer, etc.? apartment      Is it one or more stories?      How many persons live in your home? me      Do you have any pets in your home? (please list) one dog  Highest level of education completed?      Current or past profession:      Do you exercise?                 no                     Type & how often?      Advanced Directives      Do you have a living will? no      Do you have a DNR form?           no                       If not, do you want to discuss one?      Do you have signed POA/HPOA for forms?  no      Functional Status      Do you have difficulty bathing or dressing yourself?      Do you have difficulty preparing food or eating?       Do you have difficulty managing your medications?      Do you have difficulty managing your finances?      Do you have difficulty affording your medications?   Social Determinants of Health   Financial Resource Strain: Not on file  Food Insecurity: Not on file  Transportation Needs: Not on file  Physical Activity: Not on file  Stress: Not on file  Social Connections: Not on file  Intimate Partner Violence: Not on file    Family History: Family History  Problem Relation Age of Onset   Pancreatic cancer Sister    Lung cancer Maternal Grandmother    Breast cancer Maternal Aunt    Breast cancer Maternal Aunt    Pancreatic cancer Maternal Uncle    Lung cancer Maternal Uncle    Breast cancer Cousin    Breast cancer Cousin    Hypertension Other    Cancer Other    Colon cancer Neg Hx    Stomach cancer Neg Hx    Esophageal cancer Neg Hx     Review of Systems: ROS  Physical Exam: Vital Signs BP (!) 169/84 (BP Location: Left Arm, Patient Position: Sitting, Cuff Size: Small)   Pulse 93   SpO2 97%   Physical Exam  Constitutional:      General: Not in acute distress.    Appearance: Normal appearance. Not ill-appearing.  HENT:     Head: Normocephalic  and atraumatic.  Eyes:     Pupils: Pupils are equal, round. Cardiovascular:     Rate and Rhythm: Normal rate.    Pulses: Normal pulses.  Pulmonary:     Effort: No respiratory distress or increased work of breathing.  Speaks in full sentences. Abdominal:     General: Abdomen is flat. No distension.   Musculoskeletal: Normal range of motion. No lower extremity swelling or edema. No varicosities. Skin:    General: Skin is warm and dry.     Findings: No erythema or rash.  Neurological:     Mental Status: Alert and oriented to person, place, and time.  Psychiatric:        Mood and Affect: Mood normal.        Behavior: Behavior normal.    Assessment/Plan: The patient is scheduled for bilateral breast reduction with Dr. Ulice Bold.  Risks, benefits, and alternatives of procedure discussed, questions answered and consent obtained.    Smoking Status: Non-smoker. Last  Mammogram: 03/2022; Results: BI-RADS Category 1: Negative.  Caprini Score: 8; Risk Factors include: Age, BMI greater than 25, family history of thrombosis, and length of planned surgery. Recommendation for mechanical and possibly pharmacological prophylaxis.  Will discuss with the surgeon and prescribe Lovenox if indicated.  Regardless, we will encourage early ambulation.   Pictures obtained: 10/28/2022  Post-op Rx sent to pharmacy: Keflex and Zofran.  Patient is already receiving oxycodone per PCP.  Patient was provided with the General Surgical Risk consent document and Pain Medication Agreement prior to their appointment.  They had adequate time to read through the risk consent documents and Pain Medication Agreement. We also discussed them in person together during this preop appointment. All of their questions were answered to their satisfaction.  Recommended calling if they have any further questions.  Risk consent form and Pain Medication Agreement to be scanned into patient's chart.  The risk that can be encountered with  breast reduction were discussed and include the following but not limited to these:  Breast asymmetry, fluid accumulation, firmness of the breast, inability to breast feed, loss of nipple or areola, skin loss, decrease or no nipple sensation, fat necrosis of the breast tissue, bleeding, infection, healing delay.  There are risks of anesthesia, changes to skin sensation and injury to nerves or blood vessels.  The muscle can be temporarily or permanently injured.  You may have an allergic reaction to tape, suture, glue, blood products which can result in skin discoloration, swelling, pain, skin lesions, poor healing.  Any of these can lead to the need for revisonal surgery or stage procedures.  A reduction has potential to interfere with diagnostic procedures.  Nipple or breast piercing can increase risks of infection.  This procedure is best done when the breast is fully developed.  Changes in the breast will continue to occur over time.  Pregnancy can alter the outcomes of previous breast reduction surgery, weight gain and weigh loss can also effect the long term appearance.     Electronically signed by: Evelena Leyden, PA-C 12/30/2022 11:52 AM

## 2022-12-30 NOTE — Progress Notes (Signed)
Patient ID: Deborah Camacho, female    DOB: [birth year], 67 y.o.   MRN: [id number]  Chief Complaint  Patient presents with   Pre-op Exam      ICD-10-CM   1. Symptomatic mammary hypertrophy  N62        History of Present Illness: Deborah Camacho is a 67 y.o.  female  with a history of macromastia.  She presents for preoperative evaluation for upcoming procedure, bilateral breast reduction with possible liposuction, scheduled for 01/14/2023 with Dr. Ulice Bold.  The patient has not had problems with anesthesia.  Previous hysterectomy and right shoulder surgery without complication.  Her BP is elevated today 169/84, but she reports that it is typically well-controlled at home on her multiple antihypertensives.  She is also on 3 separate diabetic medications, but states that she is doing quite well.  Her A1c at time of consult is reportedly 8.3%, but fortunately she had it rechecked earlier this week and was noted to be 6.5%.  Will want to hold her Jardiance and her Trulicity injectable 1 week prior to surgery.  Will send surgical clearance to her PCP.  She endorses a family history of thrombosis (mother), but no personal history.  She denies any significant pulmonary or cardiac disease (aside from her HTN), varicosities, cancer, or nicotine use.  Discussed risks of surgery, including infection and wound healing concerns.  We also discussed possibility for nipple areolar necrosis and/or pivoting to words free nipple graft/amputation technique if needed intraoperatively.  She is understanding and agreeable.  Summary of Previous Visit: She was seen for consult 10/28/2022.  At that time, complained of chronic upper back and neck discomfort in the context of large breasts.  STN 37 cm on the right, 38 cm on the left.  BMI equals 28.7 kg/m.  Preoperative bra size equals F or H cup.  She expressed that she would be okay with a C/D cup.  Estimated excess breast tissue to be removed at time of surgery was  450 g each side.  Mammogram 03/2022 was negative.  Job: N/A.    PMH Significant for: Macromastia, T2DM, HTN, HLD, chronic pain syndrome on oxycodone 10 mg 4 times daily recently filled 12/22/2022 by Dr. Mayford Knife.    Past Medical History: Allergies: No Known Allergies  Current Medications:  Current Outpatient Medications:    atorvastatin (LIPITOR) 40 MG tablet, atorvastatin 40 mg tablet, Disp: , Rfl:    Blood Glucose Monitoring Suppl (ACCU-CHEK GUIDE ME) w/Device KIT, 3 (three) times daily. for testing, Disp: , Rfl:    fluconazole (DIFLUCAN) 150 MG tablet, , Disp: , Rfl:    fluticasone (FLONASE) 50 MCG/ACT nasal spray, Place 2 sprays into both nostrils daily., Disp: 16 g, Rfl: 0   gabapentin (NEURONTIN) 100 MG capsule, Take 100 mg by mouth 2 (two) times daily., Disp: , Rfl:    glimepiride (AMARYL) 4 MG tablet, Take 4 mg by mouth at bedtime., Disp: , Rfl: 3   hydrOXYzine (ATARAX) 25 MG tablet, Take 25 mg by mouth 2 (two) times daily as needed., Disp: , Rfl:    INVOKANA 100 MG TABS tablet, Take 100 mg by mouth daily., Disp: , Rfl: 3   JARDIANCE 25 MG TABS tablet, Take 25 mg by mouth daily., Disp: , Rfl:    ketoconazole (NIZORAL) 2 % cream, Apply to both feet and between toes once daily for 6 weeks., Disp: 60 g, Rfl: 1   lisinopril-hydrochlorothiazide (PRINZIDE,ZESTORETIC) 20-25 MG tablet, Take 1 tablet by mouth  daily., Disp: , Rfl:    Multiple Vitamin (MULTIVITAMIN WITH MINERALS) TABS tablet, Take 1 tablet by mouth daily., Disp: , Rfl:    NARCAN 4 MG/0.1ML LIQD nasal spray kit, , Disp: , Rfl: 0   omeprazole (PRILOSEC) 20 MG capsule, Take 20 mg by mouth every morning., Disp: , Rfl:    Oxycodone HCl 10 MG TABS, , Disp: , Rfl:    oxyCODONE-acetaminophen (PERCOCET) 10-325 MG tablet, Take 1 tablet by mouth every 4 (four) hours as needed for pain., Disp: 20 tablet, Rfl: 0   rosuvastatin (CRESTOR) 40 MG tablet, , Disp: , Rfl:    temazepam (RESTORIL) 30 MG capsule, , Disp: , Rfl:    traZODone  (DESYREL) 50 MG tablet, Take 50 mg by mouth at bedtime as needed., Disp: , Rfl:    TRULICITY 3 MG/0.5ML SOPN, Inject 3 mg into the skin once a week. Every Tuesday, Disp: , Rfl:    valsartan-hydrochlorothiazide (DIOVAN-HCT) 160-12.5 MG tablet, Take 1 tablet by mouth daily., Disp: , Rfl:    zolpidem (AMBIEN) 10 MG tablet, , Disp: , Rfl:   Past Medical Problems: Past Medical History:  Diagnosis Date   Arthritis    Arthritis    both legs   Back pain    Diabetes mellitus    TYPE 2   Dyslipidemia    Hand pain    Hyperlipidemia    Hypertension    Leg pain    Vitamin D deficiency     Past Surgical History: Past Surgical History:  Procedure Laterality Date   ABDOMINAL HYSTERECTOMY     CARPAL TUNNEL RELEASE Bilateral    ROBOTIC ASSISTED TOTAL HYSTERECTOMY WITH BILATERAL SALPINGO OOPHERECTOMY Bilateral 11/04/2017   Procedure: XI ROBOTIC ASSISTED TOTAL HYSTERECTOMY WITH BILATERAL SALPINGO OOPHORECTOMY;  Surgeon: Adolphus Birchwood, MD;  Location: [facility];  Service: Gynecology;  Laterality: Bilateral;   SENTINEL NODE BIOPSY N/A 11/04/2017   Procedure: SENTINEL LYMPH NODE BIOPSY;  Surgeon: Adolphus Birchwood, MD;  Location: [facility];  Service: Gynecology;  Laterality: N/A;   TUBAL LIGATION  1978    Social History: Social History   Socioeconomic History   Marital status: Legally Separated    Spouse name: Not on file   Number of children: Not on file   Years of education: Not on file   Highest education level: Not on file  Occupational History   Not on file  Tobacco Use   Smoking status: Never   Smokeless tobacco: Never  Vaping Use   Vaping status: Never Used  Substance and Sexual Activity   Alcohol use: No   Drug use: Never   Sexual activity: Not on file  Other Topics Concern   Not on file  Social History Narrative   Social History      Diet?        Do you drink/eat things with caffeine? yes      Marital status?                                    What year were you married?      Do you  live in a house, apartment, assisted living, condo, trailer, etc.? apartment      Is it one or more stories?      How many persons live in your home? me      Do you have any pets in your home? (please list) one dog  Highest level of education completed?      Current or past profession:      Do you exercise?                 no                     Type & how often?      Advanced Directives      Do you have a living will? no      Do you have a DNR form?           no                       If not, do you want to discuss one?      Do you have signed POA/HPOA for forms?  no      Functional Status      Do you have difficulty bathing or dressing yourself?      Do you have difficulty preparing food or eating?       Do you have difficulty managing your medications?      Do you have difficulty managing your finances?      Do you have difficulty affording your medications?   Social Determinants of Health   Financial Resource Strain: Not on file  Food Insecurity: Not on file  Transportation Needs: Not on file  Physical Activity: Not on file  Stress: Not on file  Social Connections: Not on file  Intimate Partner Violence: Not on file    Family History: Family History  Problem Relation Age of Onset   Pancreatic cancer Sister    Lung cancer Maternal Grandmother    Breast cancer Maternal Aunt    Breast cancer Maternal Aunt    Pancreatic cancer Maternal Uncle    Lung cancer Maternal Uncle    Breast cancer Cousin    Breast cancer Cousin    Hypertension Other    Cancer Other    Colon cancer Neg Hx    Stomach cancer Neg Hx    Esophageal cancer Neg Hx     Review of Systems: ROS  Physical Exam: Vital Signs BP (!) 169/84 (BP Location: Left Arm, Patient Position: Sitting, Cuff Size: Small)   Pulse 93   SpO2 97%   Physical Exam  Constitutional:      General: Not in acute distress.    Appearance: Normal appearance. Not ill-appearing.  HENT:     Head: Normocephalic  and atraumatic.  Eyes:     Pupils: Pupils are equal, round. Cardiovascular:     Rate and Rhythm: Normal rate.    Pulses: Normal pulses.  Pulmonary:     Effort: No respiratory distress or increased work of breathing.  Speaks in full sentences. Abdominal:     General: Abdomen is flat. No distension.   Musculoskeletal: Normal range of motion. No lower extremity swelling or edema. No varicosities. Skin:    General: Skin is warm and dry.     Findings: No erythema or rash.  Neurological:     Mental Status: Alert and oriented to person, place, and time.  Psychiatric:        Mood and Affect: Mood normal.        Behavior: Behavior normal.    Assessment/Plan: The patient is scheduled for bilateral breast reduction with Dr. Ulice Bold.  Risks, benefits, and alternatives of procedure discussed, questions answered and consent obtained.    Smoking Status: Non-smoker. Last  Mammogram: 03/2022; Results: BI-RADS Category 1: Negative.  Caprini Score: 8; Risk Factors include: Age, BMI greater than 25, family history of thrombosis, and length of planned surgery. Recommendation for mechanical and possibly pharmacological prophylaxis.  Will discuss with the surgeon and prescribe Lovenox if indicated.  Regardless, we will encourage early ambulation.   Pictures obtained: 10/28/2022  Post-op Rx sent to pharmacy: Keflex and Zofran.  Patient is already receiving oxycodone per PCP.  Patient was provided with the General Surgical Risk consent document and Pain Medication Agreement prior to their appointment.  They had adequate time to read through the risk consent documents and Pain Medication Agreement. We also discussed them in person together during this preop appointment. All of their questions were answered to their satisfaction.  Recommended calling if they have any further questions.  Risk consent form and Pain Medication Agreement to be scanned into patient's chart.  The risk that can be encountered with  breast reduction were discussed and include the following but not limited to these:  Breast asymmetry, fluid accumulation, firmness of the breast, inability to breast feed, loss of nipple or areola, skin loss, decrease or no nipple sensation, fat necrosis of the breast tissue, bleeding, infection, healing delay.  There are risks of anesthesia, changes to skin sensation and injury to nerves or blood vessels.  The muscle can be temporarily or permanently injured.  You may have an allergic reaction to tape, suture, glue, blood products which can result in skin discoloration, swelling, pain, skin lesions, poor healing.  Any of these can lead to the need for revisonal surgery or stage procedures.  A reduction has potential to interfere with diagnostic procedures.  Nipple or breast piercing can increase risks of infection.  This procedure is best done when the breast is fully developed.  Changes in the breast will continue to occur over time.  Pregnancy can alter the outcomes of previous breast reduction surgery, weight gain and weigh loss can also effect the long term appearance.     Electronically signed by: Evelena Leyden, PA-C 12/30/2022 11:52 AM

## 2023-01-06 ENCOUNTER — Encounter (HOSPITAL_BASED_OUTPATIENT_CLINIC_OR_DEPARTMENT_OTHER): Payer: Self-pay | Admitting: Plastic Surgery

## 2023-01-06 ENCOUNTER — Other Ambulatory Visit: Payer: Self-pay

## 2023-01-11 ENCOUNTER — Encounter (HOSPITAL_BASED_OUTPATIENT_CLINIC_OR_DEPARTMENT_OTHER)
Admission: RE | Admit: 2023-01-11 | Discharge: 2023-01-11 | Disposition: A | Discharge status: Home / Self Care | Payer: Medicare HMO | Source: Ambulatory Visit | Attending: Plastic Surgery | Admitting: Plastic Surgery

## 2023-01-11 DIAGNOSIS — R9431 Abnormal electrocardiogram [ECG] [EKG]: Secondary | ICD-10-CM | POA: Insufficient documentation

## 2023-01-11 DIAGNOSIS — Z01810 Encounter for preprocedural cardiovascular examination: Secondary | ICD-10-CM | POA: Diagnosis not present

## 2023-01-11 DIAGNOSIS — Z01818 Encounter for other preprocedural examination: Secondary | ICD-10-CM | POA: Diagnosis present

## 2023-01-11 DIAGNOSIS — Z01812 Encounter for preprocedural laboratory examination: Secondary | ICD-10-CM | POA: Insufficient documentation

## 2023-01-11 LAB — BASIC METABOLIC PANEL
Anion gap: 9 (ref 5–15)
BUN: 13 mg/dL (ref 8–23)
CO2: 25 mmol/L (ref 22–32)
Calcium: 9 mg/dL (ref 8.9–10.3)
Chloride: 106 mmol/L (ref 98–111)
Creatinine, Ser: 1.17 mg/dL — ABNORMAL HIGH (ref 0.44–1.00)
GFR, Estimated: 51 mL/min — ABNORMAL LOW (ref >60)
Glucose, Bld: 205 mg/dL — ABNORMAL HIGH (ref 70–99)
Potassium: 4.1 mmol/L (ref 3.5–5.1)
Sodium: 140 mmol/L (ref 135–145)

## 2023-01-14 ENCOUNTER — Ambulatory Visit (HOSPITAL_BASED_OUTPATIENT_CLINIC_OR_DEPARTMENT_OTHER): Payer: Medicare HMO | Admitting: Anesthesiology

## 2023-01-14 ENCOUNTER — Other Ambulatory Visit: Payer: Self-pay

## 2023-01-14 ENCOUNTER — Encounter (HOSPITAL_BASED_OUTPATIENT_CLINIC_OR_DEPARTMENT_OTHER): Payer: Self-pay | Admitting: Plastic Surgery

## 2023-01-14 ENCOUNTER — Ambulatory Visit (HOSPITAL_BASED_OUTPATIENT_CLINIC_OR_DEPARTMENT_OTHER)
Admission: RE | Admit: 2023-01-14 | Discharge: 2023-01-14 | Disposition: A | Discharge status: Home / Self Care | Payer: Medicare HMO | Source: Ambulatory Visit | Attending: Plastic Surgery | Admitting: Plastic Surgery

## 2023-01-14 ENCOUNTER — Encounter (HOSPITAL_BASED_OUTPATIENT_CLINIC_OR_DEPARTMENT_OTHER)
Admission: RE | Disposition: A | Discharge status: Home / Self Care | Payer: Self-pay | Source: Ambulatory Visit | Attending: Plastic Surgery

## 2023-01-14 DIAGNOSIS — Z8249 Family history of ischemic heart disease and other diseases of the circulatory system: Secondary | ICD-10-CM | POA: Insufficient documentation

## 2023-01-14 DIAGNOSIS — Z79899 Other long term (current) drug therapy: Secondary | ICD-10-CM | POA: Diagnosis not present

## 2023-01-14 DIAGNOSIS — E119 Type 2 diabetes mellitus without complications: Secondary | ICD-10-CM | POA: Insufficient documentation

## 2023-01-14 DIAGNOSIS — N62 Hypertrophy of breast: Secondary | ICD-10-CM | POA: Diagnosis present

## 2023-01-14 DIAGNOSIS — M549 Dorsalgia, unspecified: Secondary | ICD-10-CM | POA: Diagnosis not present

## 2023-01-14 DIAGNOSIS — Z7985 Long-term (current) use of injectable non-insulin antidiabetic drugs: Secondary | ICD-10-CM | POA: Diagnosis not present

## 2023-01-14 DIAGNOSIS — I10 Essential (primary) hypertension: Secondary | ICD-10-CM | POA: Diagnosis not present

## 2023-01-14 DIAGNOSIS — M542 Cervicalgia: Secondary | ICD-10-CM | POA: Insufficient documentation

## 2023-01-14 DIAGNOSIS — Z7984 Long term (current) use of oral hypoglycemic drugs: Secondary | ICD-10-CM | POA: Insufficient documentation

## 2023-01-14 DIAGNOSIS — Z01818 Encounter for other preprocedural examination: Secondary | ICD-10-CM

## 2023-01-14 HISTORY — PX: BREAST REDUCTION SURGERY: SHX8

## 2023-01-14 LAB — GLUCOSE, CAPILLARY
Glucose-Capillary: 191 mg/dL — ABNORMAL HIGH (ref 70–99)
Glucose-Capillary: 190 mg/dL — ABNORMAL HIGH (ref 70–99)

## 2023-01-14 SURGERY — MAMMOPLASTY, REDUCTION
Anesthesia: General | Site: Breast | Laterality: Bilateral

## 2023-01-14 MED ORDER — BUPIVACAINE LIPOSOME 1.3 % IJ SUSP
INTRAMUSCULAR | Status: AC
  Filled 2023-01-14: qty 20

## 2023-01-14 MED ORDER — LACTATED RINGERS IV SOLN: INTRAVENOUS | Status: DC

## 2023-01-14 MED ORDER — HYDROMORPHONE HCL 1 MG/ML IJ SOLN
INTRAMUSCULAR | Status: AC
  Filled 2023-01-14: qty 0.5

## 2023-01-14 MED ORDER — LIDOCAINE-EPINEPHRINE 1 %-1:100000 IJ SOLN
INTRAMUSCULAR | Status: DC | PRN
  Administered 2023-01-14: 50 mL

## 2023-01-14 MED ORDER — ACETAMINOPHEN 325 MG PO TABS: 650.0000 mg | ORAL_TABLET | ORAL | Status: DC | PRN

## 2023-01-14 MED ORDER — SODIUM CHLORIDE 0.9% FLUSH: 3.0000 mL | Freq: Two times a day (BID) | INTRAVENOUS | Status: DC

## 2023-01-14 MED ORDER — CHLORHEXIDINE GLUCONATE CLOTH 2 % EX PADS: 6.0000 | MEDICATED_PAD | Freq: Once | CUTANEOUS | Status: DC

## 2023-01-14 MED ORDER — PROPOFOL 10 MG/ML IV BOLUS
INTRAVENOUS | Status: DC | PRN
  Administered 2023-01-14: 200 mg via INTRAVENOUS

## 2023-01-14 MED ORDER — FENTANYL CITRATE (PF) 100 MCG/2ML IJ SOLN
INTRAMUSCULAR | Status: DC | PRN
  Administered 2023-01-14 (×2): 25 ug via INTRAVENOUS
  Administered 2023-01-14: 50 ug via INTRAVENOUS
  Administered 2023-01-14 (×4): 25 ug via INTRAVENOUS

## 2023-01-14 MED ORDER — FENTANYL CITRATE (PF) 100 MCG/2ML IJ SOLN: 25.0000 ug | INTRAMUSCULAR | Status: DC | PRN

## 2023-01-14 MED ORDER — CEFAZOLIN SODIUM-DEXTROSE 2-4 GM/100ML-% IV SOLN
INTRAVENOUS | Status: AC
  Filled 2023-01-14: qty 100

## 2023-01-14 MED ORDER — FENTANYL CITRATE (PF) 100 MCG/2ML IJ SOLN
INTRAMUSCULAR | Status: AC
  Filled 2023-01-14: qty 2

## 2023-01-14 MED ORDER — PROPOFOL 10 MG/ML IV BOLUS
INTRAVENOUS | Status: AC
  Filled 2023-01-14: qty 20

## 2023-01-14 MED ORDER — ACETAMINOPHEN 325 MG RE SUPP: 650.0000 mg | RECTAL | Status: DC | PRN

## 2023-01-14 MED ORDER — FENTANYL CITRATE (PF) 100 MCG/2ML IJ SOLN
25.0000 ug | INTRAMUSCULAR | Status: DC | PRN
  Administered 2023-01-14 (×3): 50 ug via INTRAVENOUS

## 2023-01-14 MED ORDER — SODIUM CHLORIDE 0.9% FLUSH: 3.0000 mL | INTRAVENOUS | Status: DC | PRN

## 2023-01-14 MED ORDER — HYDROMORPHONE HCL 1 MG/ML IJ SOLN
0.2500 mg | INTRAMUSCULAR | Status: DC | PRN
  Administered 2023-01-14 (×2): 0.5 mg via INTRAVENOUS

## 2023-01-14 MED ORDER — LIDOCAINE 2% (20 MG/ML) 5 ML SYRINGE
INTRAMUSCULAR | Status: AC
  Filled 2023-01-14: qty 5

## 2023-01-14 MED ORDER — PHENYLEPHRINE HCL (PRESSORS) 10 MG/ML IV SOLN
INTRAVENOUS | Status: DC | PRN
  Administered 2023-01-14: 80 ug via INTRAVENOUS
  Administered 2023-01-14: 100 ug via INTRAVENOUS
  Administered 2023-01-14 (×2): 80 ug via INTRAVENOUS
  Administered 2023-01-14: 100 ug via INTRAVENOUS

## 2023-01-14 MED ORDER — OXYCODONE HCL 5 MG PO TABS
5.0000 mg | ORAL_TABLET | Freq: Once | ORAL | Status: AC
  Administered 2023-01-14: 5 mg via ORAL

## 2023-01-14 MED ORDER — ONDANSETRON HCL 4 MG/2ML IJ SOLN
INTRAMUSCULAR | Status: DC | PRN
  Administered 2023-01-14: 4 mg via INTRAVENOUS

## 2023-01-14 MED ORDER — DEXAMETHASONE SODIUM PHOSPHATE 10 MG/ML IJ SOLN
INTRAMUSCULAR | Status: AC
  Filled 2023-01-14: qty 1

## 2023-01-14 MED ORDER — OXYCODONE HCL 5 MG PO TABS: 5.0000 mg | ORAL_TABLET | Freq: Three times a day (TID) | ORAL | 0 refills | Status: AC | PRN

## 2023-01-14 MED ORDER — ONDANSETRON HCL 4 MG/2ML IJ SOLN
INTRAMUSCULAR | Status: AC
  Filled 2023-01-14: qty 2

## 2023-01-14 MED ORDER — VASHE WOUND IRRIGATION OPTIME
TOPICAL | Status: DC | PRN
Start: 2023-01-14 — End: 2023-01-14
  Administered 2023-01-14: 34 [oz_av]

## 2023-01-14 MED ORDER — LIDOCAINE HCL (CARDIAC) PF 100 MG/5ML IV SOSY
PREFILLED_SYRINGE | INTRAVENOUS | Status: DC | PRN
  Administered 2023-01-14: 50 mg via INTRAVENOUS

## 2023-01-14 MED ORDER — SODIUM CHLORIDE 0.9 % IV SOLN: 250.0000 mL | INTRAVENOUS | Status: DC | PRN

## 2023-01-14 MED ORDER — SODIUM CHLORIDE (PF) 0.9 % IJ SOLN
INTRAMUSCULAR | Status: DC | PRN
  Administered 2023-01-14: 20 mL

## 2023-01-14 MED ORDER — ACETAMINOPHEN 500 MG PO TABS
1000.0000 mg | ORAL_TABLET | Freq: Once | ORAL | Status: AC
  Administered 2023-01-14: 1000 mg via ORAL

## 2023-01-14 MED ORDER — OXYCODONE HCL 5 MG PO TABS
ORAL_TABLET | ORAL | Status: AC
  Filled 2023-01-14: qty 1

## 2023-01-14 MED ORDER — MIDAZOLAM HCL 2 MG/2ML IJ SOLN
INTRAMUSCULAR | Status: AC
  Filled 2023-01-14: qty 2

## 2023-01-14 MED ORDER — BUPIVACAINE LIPOSOME 1.3 % IJ SUSP
INTRAMUSCULAR | Status: DC | PRN
Start: 2023-01-14 — End: 2023-01-14
  Administered 2023-01-14: 20 mL

## 2023-01-14 MED ORDER — OXYCODONE HCL 5 MG PO TABS: 5.0000 mg | ORAL_TABLET | ORAL | Status: DC | PRN

## 2023-01-14 MED ORDER — ACETAMINOPHEN 500 MG PO TABS
ORAL_TABLET | ORAL | Status: AC
  Filled 2023-01-14: qty 2

## 2023-01-14 MED ORDER — LIDOCAINE HCL 1 % IJ SOLN
INTRAVENOUS | Status: DC | PRN
  Administered 2023-01-14: 1000 mL

## 2023-01-14 MED ORDER — PHENYLEPHRINE HCL (PRESSORS) 10 MG/ML IV SOLN
INTRAVENOUS | Status: AC
  Filled 2023-01-14: qty 1

## 2023-01-14 MED ORDER — MIDAZOLAM HCL 5 MG/5ML IJ SOLN
INTRAMUSCULAR | Status: DC | PRN
  Administered 2023-01-14: .5 mg via INTRAVENOUS
  Administered 2023-01-14: 1 mg via INTRAVENOUS

## 2023-01-14 MED ORDER — DEXAMETHASONE SODIUM PHOSPHATE 10 MG/ML IJ SOLN
INTRAMUSCULAR | Status: DC | PRN
Start: 2023-01-14 — End: 2023-01-14
  Administered 2023-01-14: 5 mg via INTRAVENOUS

## 2023-01-14 MED ORDER — PHENYLEPHRINE HCL-NACL 20-0.9 MG/250ML-% IV SOLN
INTRAVENOUS | Status: DC | PRN
Start: 2023-01-14 — End: 2023-01-14
  Administered 2023-01-14: 40 ug/min via INTRAVENOUS

## 2023-01-14 MED ORDER — CEFAZOLIN SODIUM-DEXTROSE 2-4 GM/100ML-% IV SOLN
2.0000 g | INTRAVENOUS | Status: AC
  Administered 2023-01-14: 2 g via INTRAVENOUS

## 2023-01-14 SURGICAL SUPPLY — 67 items
ADH SKN CLS APL DERMABOND .7 (GAUZE/BANDAGES/DRESSINGS) ×4
AGENT HMST PWDR BTL CLGN 5GM (Miscellaneous) ×1 IMPLANT
BAG DECANTER FOR FLEXI CONT (MISCELLANEOUS) IMPLANT
BINDER BREAST LRG (GAUZE/BANDAGES/DRESSINGS) IMPLANT
BINDER BREAST MEDIUM (GAUZE/BANDAGES/DRESSINGS) IMPLANT
BINDER BREAST XLRG (GAUZE/BANDAGES/DRESSINGS) IMPLANT
BINDER BREAST XXLRG (GAUZE/BANDAGES/DRESSINGS) IMPLANT
BIOPATCH RED 1 DISK 7.0 (GAUZE/BANDAGES/DRESSINGS) IMPLANT
BLADE HEX COATED 2.75 (ELECTRODE) ×1 IMPLANT
BLADE KNIFE PERSONA 10 (BLADE) ×2 IMPLANT
BLADE SURG 15 STRL LF DISP TIS (BLADE) IMPLANT
BLADE SURG 15 STRL SS (BLADE) ×1
CANISTER SUCT 1200ML W/VALVE (MISCELLANEOUS) ×1 IMPLANT
CLEANSER WND VASHE 34 (WOUND CARE) IMPLANT
COLLAGEN CELLERATERX 5 GRAM (Miscellaneous) IMPLANT
COVER BACK TABLE 60X90IN (DRAPES) ×1 IMPLANT
COVER MAYO STAND STRL (DRAPES) ×1 IMPLANT
DERMABOND ADVANCED .7 DNX12 (GAUZE/BANDAGES/DRESSINGS) ×2 IMPLANT
DRAIN CHANNEL 19F RND (DRAIN) IMPLANT
DRAPE LAPAROSCOPIC ABDOMINAL (DRAPES) ×1 IMPLANT
DRSG MEPILEX POST OP 4X8 (GAUZE/BANDAGES/DRESSINGS) ×2 IMPLANT
DRSG OPSITE POSTOP 4X12 (GAUZE/BANDAGES/DRESSINGS) IMPLANT
ELECT BLADE 4.0 EZ CLEAN MEGAD (MISCELLANEOUS) ×1
ELECT REM PT RETURN 9FT ADLT (ELECTROSURGICAL) ×1
ELECTRODE BLDE 4.0 EZ CLN MEGD (MISCELLANEOUS) ×1 IMPLANT
ELECTRODE REM PT RTRN 9FT ADLT (ELECTROSURGICAL) ×1 IMPLANT
EVACUATOR SILICONE 100CC (DRAIN) IMPLANT
GAUZE PAD ABD 8X10 STRL (GAUZE/BANDAGES/DRESSINGS) ×2 IMPLANT
GAUZE SPONGE 4X4 12PLY STRL LF (GAUZE/BANDAGES/DRESSINGS) IMPLANT
GLOVE BIO SURGEON STRL SZ 6.5 (GLOVE) ×3 IMPLANT
GLOVE BIOGEL PI IND STRL 7.0 (GLOVE) ×1 IMPLANT
GOWN STRL REUS W/ TWL LRG LVL3 (GOWN DISPOSABLE) ×2 IMPLANT
GOWN STRL REUS W/TWL LRG LVL3 (GOWN DISPOSABLE) ×3
NDL FILTER BLUNT 18X1 1/2 (NEEDLE) ×1 IMPLANT
NDL HYPO 25X1 1.5 SAFETY (NEEDLE) ×1 IMPLANT
NDL SAFETY ECLIP 18X1.5 (MISCELLANEOUS) IMPLANT
NEEDLE FILTER BLUNT 18X1 1/2 (NEEDLE) ×1
NEEDLE HYPO 25X1 1.5 SAFETY (NEEDLE) ×2
NS IRRIG 1000ML POUR BTL (IV SOLUTION) IMPLANT
PACK BASIN DAY SURGERY FS (CUSTOM PROCEDURE TRAY) ×1 IMPLANT
PAD ALCOHOL SWAB (MISCELLANEOUS) IMPLANT
PAD FOAM SILICONE BACKED (GAUZE/BANDAGES/DRESSINGS) IMPLANT
PENCIL SMOKE EVACUATOR (MISCELLANEOUS) ×1 IMPLANT
PIN SAFETY STERILE (MISCELLANEOUS) IMPLANT
SLEEVE SCD COMPRESS KNEE MED (STOCKING) ×1 IMPLANT
SPIKE FLUID TRANSFER (MISCELLANEOUS) IMPLANT
SPONGE T-LAP 18X18 ~~LOC~~+RFID (SPONGE) ×2 IMPLANT
STRIP SUTURE WOUND CLOSURE 1/2 (MISCELLANEOUS) ×2 IMPLANT
SUT MNCRL AB 4-0 PS2 18 (SUTURE) ×4 IMPLANT
SUT MON AB 3-0 SH 27 (SUTURE) ×8
SUT MON AB 3-0 SH27 (SUTURE) ×4 IMPLANT
SUT MON AB 5-0 PS2 18 (SUTURE) IMPLANT
SUT PDS 3-0 CT2 (SUTURE) ×6
SUT PDS II 3-0 CT2 27 ABS (SUTURE) ×6 IMPLANT
SUT SILK 3 0 PS 1 (SUTURE) IMPLANT
SYR 3ML 23GX1 SAFETY (SYRINGE) IMPLANT
SYR 50ML LL SCALE MARK (SYRINGE) IMPLANT
SYR BULB IRRIG 60ML STRL (SYRINGE) ×1 IMPLANT
SYR CONTROL 10ML LL (SYRINGE) ×1 IMPLANT
TAPE MEASURE VINYL STERILE (MISCELLANEOUS) IMPLANT
TOWEL GREEN STERILE FF (TOWEL DISPOSABLE) ×2 IMPLANT
TRAY DSU PREP LF (CUSTOM PROCEDURE TRAY) ×1 IMPLANT
TUBE CONNECTING 20X1/4 (TUBING) ×1 IMPLANT
TUBING INFILTRATION IT-10001 (TUBING) IMPLANT
TUBING SET GRADUATE ASPIR 12FT (MISCELLANEOUS) IMPLANT
UNDERPAD 30X36 HEAVY ABSORB (UNDERPADS AND DIAPERS) ×2 IMPLANT
YANKAUER SUCT BULB TIP NO VENT (SUCTIONS) ×1 IMPLANT

## 2023-01-14 NOTE — Op Note (Addendum)
Breast Reduction Op note:    DATE OF PROCEDURE: 01/14/2023  LOCATION: [facility]  SURGEON: Foster Simpson, DO  ASSISTANT: Caroline More, PA  PREOPERATIVE DIAGNOSIS 1. Macromastia 2. Neck Pain 3. Back Pain  POSTOPERATIVE DIAGNOSIS 1. Macromastia 2. Neck Pain 3. Back Pain  PROCEDURES 1. Bilateral breast reduction.  Right reduction 716 g, Left reduction 600 g  COMPLICATIONS: None.  DRAINS: none  INDICATIONS FOR PROCEDURE Deborah Camacho is a 67 y.o. year-old female born on 1955-11-10,with a history of symptomatic macromastia with concominant back pain, neck pain, shoulder grooving from her bra.   MRN: [id number]  CONSENT Informed consent was obtained directly from the patient. The risks, benefits and alternatives were fully discussed. Specific risks including but not limited to bleeding, infection, hematoma, seroma, scarring, pain, nipple necrosis, asymmetry, poor cosmetic results, and need for further surgery were discussed. The patient's questions were answered.  DESCRIPTION OF PROCEDURE  Patient was brought into the operating room and rested on the operating room table in the supine position.  SCDs were placed and appropriate padding was performed.  Antibiotics were given. The patient underwent general anesthesia and the chest was prepped and draped in a sterile fashion.  A timeout was performed and all information was confirmed to be correct by those in the room. Tumescent was placed in the lateral breasts and then liposuction was done on each side.  Right side: Preoperative markings were confirmed.  Incision lines were injected with local containing epinephrine.  After waiting for vasoconstriction, the marked lines were incised with a #15 blade.  A Wise-pattern superomedial breast reduction was performed by de-epithelializing the pedicle, using bovie to create the superomedial pedicle, and removing breast tissue from the superior, lateral, and  inferior portions of the breast.  Care was taken to not undermine the breast pedicle. Hemostasis was achieved.  The nipple was gently rotated into position and the soft tissue closed with 4-0 Monocryl.   The pocket was irrigated and hemostasis confirmed.  The deep tissues were approximated with 3-0 PDS sutures.  The skin was closed with deep dermal 3-0 Monocryl and subcuticular 4-0 Monocryl sutures.  The nipple and skin flaps had good capillary refill at the end of the procedure.    Left side: Preoperative markings were confirmed.  Incision lines were injected with local containing epinephrine.  After waiting for vasoconstriction, the marked lines were incised with a #15 blade.  A Wise-pattern superomedial breast reduction was performed by de-epithelializing the pedicle, using bovie to create the superomedial pedicle, and removing breast tissue from the superior, lateral, and inferior portions of the breast.  Care was taken to not undermine the breast pedicle. Hemostasis was achieved.  The nipple was gently rotated into position and the soft tissue was closed with 4-0 Monocryl.  The patient was sat upright and size and shape symmetry was confirmed.  The pocket was irrigated and hemostasis confirmed.  Experel and Cellerate were placed in each pocket. The deep tissues were approximated with 3-0 PDS sutures. The skin was closed with deep dermal 3-0 Monocryl and subcuticular 4-0 Monocryl sutures.  Dermabond was applied.  A breast binder and ABDs were placed.  The nipple and skin flaps had good capillary refill at the end of the procedure.  The patient tolerated the procedure well. The patient was allowed to wake from anesthesia and taken to the recovery room in satisfactory condition.  The advanced practice practitioner (APP) assisted throughout the case.  The APP was essential in retraction  and counter traction when needed to make the case progress smoothly.  This retraction and assistance made it possible to see  the tissue plans for the procedure.  The assistance was needed for blood control, tissue re-approximation and assisted with closure of the incision site.

## 2023-01-14 NOTE — Anesthesia Postprocedure Evaluation (Signed)
Anesthesia Post Note  Patient: Deborah Camacho  Procedure(s) Performed: MAMMARY REDUCTION  (BREAST) (Bilateral: Breast)     Patient location during evaluation: PACU Anesthesia Type: General Level of consciousness: awake and alert Pain management: pain level controlled Vital Signs Assessment: post-procedure vital signs reviewed and stable Respiratory status: spontaneous breathing, nonlabored ventilation, respiratory function stable and patient connected to nasal cannula oxygen Cardiovascular status: blood pressure returned to baseline and stable Postop Assessment: no apparent nausea or vomiting Anesthetic complications: no  No notable events documented.  Last Vitals:  Vitals:   01/14/23 1556 01/14/23 1644  BP: (!) 141/73 (!) 156/79  Pulse: 91 (!) 103  Resp: 20 18  Temp:  (!) 36.1 C  SpO2: 93% 98%    Last Pain:  Vitals:   01/14/23 1644  TempSrc: Temporal  PainSc:                  Jaymason Ledesma L Steffi Noviello

## 2023-01-14 NOTE — Anesthesia Preprocedure Evaluation (Addendum)
Anesthesia Evaluation  Patient identified by MRN, date of birth, ID band Patient awake    Reviewed: Allergy & Precautions, NPO status , Patient's Chart, lab work & pertinent test results  Airway Mallampati: I  TM Distance: >3 FB Neck ROM: Full    Dental  (+) Teeth Intact, Dental Advisory Given, Chipped,    Pulmonary neg pulmonary ROS   Pulmonary exam normal breath sounds clear to auscultation       Cardiovascular hypertension, Pt. on medications Normal cardiovascular exam Rhythm:Regular Rate:Normal     Neuro/Psych negative neurological ROS  negative psych ROS   GI/Hepatic negative GI ROS, Neg liver ROS,,,  Endo/Other  diabetes, Type 2, Oral Hypoglycemic Agents    Renal/GU negative Renal ROS  negative genitourinary   Musculoskeletal negative musculoskeletal ROS (+)    Abdominal   Peds  Hematology negative hematology ROS (+)   Anesthesia Other Findings   Reproductive/Obstetrics                             Anesthesia Physical Anesthesia Plan  ASA: 2  Anesthesia Plan: General   Post-op Pain Management: Tylenol PO (pre-op)*   Induction: Intravenous  PONV Risk Score and Plan: 3 and Midazolam, Dexamethasone and Ondansetron  Airway Management Planned: Oral ETT and LMA  Additional Equipment:   Intra-op Plan:   Post-operative Plan: Extubation in OR  Informed Consent: I have reviewed the patients History and Physical, chart, labs and discussed the procedure including the risks, benefits and alternatives for the proposed anesthesia with the patient or authorized representative who has indicated his/her understanding and acceptance.     Dental advisory given  Plan Discussed with: CRNA  Anesthesia Plan Comments:        Anesthesia Quick Evaluation

## 2023-01-14 NOTE — Anesthesia Procedure Notes (Signed)
Procedure Name: LMA Insertion Date/Time: 01/14/2023 12:13 PM  Performed by: Garth Bigness, CRNAPre-anesthesia Checklist: Patient identified, Emergency Drugs available, Suction available and Patient being monitored Patient Re-evaluated:Patient Re-evaluated prior to induction Oxygen Delivery Method: Circle system utilized Preoxygenation: Pre-oxygenation with 100% oxygen Induction Type: IV induction Ventilation: Mask ventilation without difficulty LMA: LMA inserted LMA Size: 4.0 Tube type: Oral Number of attempts: 1 Placement Confirmation: positive ETCO2 and breath sounds checked- equal and bilateral Tube secured with: Tape Dental Injury: Teeth and Oropharynx as per pre-operative assessment

## 2023-01-14 NOTE — Discharge Instructions (Addendum)
INSTRUCTIONS FOR AFTER BREAST SURGERY   You will likely have some questions about what to expect following your operation.  The following information will help you and your family understand what to expect when you are discharged from the hospital.  It is important to follow these guidelines to help ensure a smooth recovery and reduce complication.  Postoperative instructions include information on: diet, wound care, medications and physical activity.  AFTER SURGERY Expect to go home after the procedure.  In some cases, you may need to spend one night in the hospital for observation.  DIET Breast surgery does not require a specific diet.  However, the healthier you eat the better your body will heal. It is important to increasing your protein intake.  This means limiting the foods with sugar and carbohydrates.  Focus on vegetables and some meat.  If you have liposuction during your procedure be sure to drink water.  If your urine is bright yellow, then it is concentrated, and you need to drink more water.  As a general rule after surgery, you should have 8 ounces of water every hour while awake.  If you find you are persistently nauseated or unable to take in liquids let us know.  NO TOBACCO USE or EXPOSURE.  This will slow your healing process and lead to a wound.  WOUND CARE Leave the binder on for 3 days . Use fragrance free soap like Dial, Dove or [place].   After 3 days you can remove the binder to shower. Once dry apply binder or sports bra. If you have liposuction you will have a soft and spongy dressing (Lipofoam) that helps prevent creases in your skin.  Remove before you shower and then replace it.  It is also available on [employer]. If you have steri-strips / tape directly attached to your skin leave them in place. It is OK to get these wet.   No baths, pools or hot tubs for four weeks. We close your incision to leave the smallest and best-looking scar. No ointment or creams on your incisions  for four weeks.  No Neosporin (Too many skin reactions).  A few weeks after surgery you can use Mederma and start massaging the scar. We ask you to wear your binder or sports bra for the first 6 weeks around the clock, including while sleeping. This provides added comfort and helps reduce the fluid accumulation at the surgery site. NO Ice or heating pads to the operative site.  You have a very high risk of a BURN before you feel the temperature change.  ACTIVITY No heavy lifting until cleared by the doctor.  This usually means no more than a half-gallon of milk.  It is OK to walk and climb stairs. Moving your legs is very important to decrease your risk of a blood clot.  It will also help keep you from getting deconditioned.  Every 1 to 2 hours get up and walk for 5 minutes. This will help with a quicker recovery back to normal.  Let pain be your guide so you don't do too much.  This time is for you to recover.  You will be more comfortable if you sleep and rest with your head elevated either with a few pillows under you or in a recliner.  No stomach sleeping for a three months.  WORK Everyone returns to work at different times. As a rough guide, most people take at least 1 - 2 weeks off prior to returning to work. If  you need documentation for your job, give the forms to the front staff at the clinic.  DRIVING Arrange for someone to bring you home from the hospital after your surgery.  You may be able to drive a few days after surgery but not while taking any narcotics or valium.  BOWEL MOVEMENTS Constipation can occur after anesthesia and while taking pain medication.  It is important to stay ahead for your comfort.  We recommend taking Milk of Magnesia (2 tablespoons; twice a day) while taking the pain pills.  MEDICATIONS You may be prescribed should start after surgery At your preoperative visit for you history and physical you may have been given the following medications: An antibiotic: Start  this medication when you get home and take according to the instructions on the bottle. Zofran 4 mg:  This is to treat nausea and vomiting.  You can take this every 6 hours as needed and only if needed. Valium 2 mg for breast cancer patients: This is for muscle tightness if you have an implant or expander. This will help relax your muscle which also helps with pain control.  This can be taken every 12 hours as needed. Don't drive after taking this medication. Norco (hydrocodone/acetaminophen) 5/325 mg:  This is only to be used after you have taken the Motrin or the Tylenol. Every 8 hours as needed.   Over the counter Medication to take: Ibuprofen (Motrin) 600 mg:  Take this every 6 hours.  If you have additional pain then take 500 mg of the Tylenol every 8 hours.  Only take the Norco after you have tried these two. MiraLAX or Milk of Magnesia: Take this according to the bottle if you take the Norco.  WHEN TO CALL Call your surgeon's office if any of the following occur: Fever 101 degrees F or greater Excessive bleeding or fluid from the incision site. Pain that increases over time without aid from the medications Redness, warmth, or pus draining from incision sites Persistent nausea or inability to take in liquids Severe misshapen area that underwent the operation.   Post Anesthesia Home Care Instructions  Activity: Get plenty of rest for the remainder of the day. A responsible individual must stay with you for 24 hours following the procedure.  For the next 24 hours, DO NOT: -Drive a car -Advertising copywriter -Drink alcoholic beverages -Take any medication unless instructed by your physician -Make any legal decisions or sign important papers.  Meals: Start with liquid foods such as gelatin or soup. Progress to regular foods as tolerated. Avoid greasy, spicy, heavy foods. If nausea and/or vomiting occur, drink only clear liquids until the nausea and/or vomiting subsides. Call your  physician if vomiting continues.  Special Instructions/Symptoms: Your throat may feel dry or sore from the anesthesia or the breathing tube placed in your throat during surgery. If this causes discomfort, gargle with warm salt water. The discomfort should disappear within 24 hours.  If you had a scopolamine patch placed behind your ear for the management of post- operative nausea and/or vomiting:  1. The medication in the patch is effective for 72 hours, after which it should be removed.  Wrap patch in a tissue and discard in the trash. Wash hands thoroughly with soap and water. 2. You may remove the patch earlier than 72 hours if you experience unpleasant side effects which may include dry mouth, dizziness or visual disturbances. 3. Avoid touching the patch. Wash your hands with soap and water after contact with the  patch.  No tylenol until after 3:45 if needed today.

## 2023-01-14 NOTE — Transfer of Care (Signed)
Immediate Anesthesia Transfer of Care Note  Patient: Deborah Camacho  Procedure(s) Performed: MAMMARY REDUCTION  (BREAST) (Bilateral: Breast)  Patient Location: PACU  Anesthesia Type:General  Level of Consciousness: awake, alert , oriented, and patient cooperative  Airway & Oxygen Therapy: Patient Spontanous Breathing and Patient connected to face mask oxygen  Post-op Assessment: Report given to RN and Post -op Vital signs reviewed and stable  Post vital signs: Reviewed and stable  Last Vitals:  Vitals Value Taken Time  BP 140/71 01/14/23 1411  Temp    Pulse 66 01/14/23 1413  Resp 16 01/14/23 1413  SpO2 100 % 01/14/23 1413  Vitals shown include unfiled device data.  Last Pain:  Vitals:   01/14/23 0914  TempSrc: Oral  PainSc: 0-No pain      Patients Stated Pain Goal: 5 (01/14/23 0914)  Complications: No notable events documented.

## 2023-01-14 NOTE — Interval H&P Note (Signed)
History and Physical Interval Note:  01/14/2023 11:23 AM  Deborah Camacho  has presented today for surgery, with the diagnosis of MAMMOPLASTY.  The various methods of treatment have been discussed with the patient and family. After consideration of risks, benefits and other options for treatment, the patient has consented to  Procedure(s): MAMMARY REDUCTION  (BREAST) (Bilateral) as a surgical intervention.  The patient's history has been reviewed, patient examined, no change in status, stable for surgery.  I have reviewed the patient's chart and labs.  Questions were answered to the patient's satisfaction.     Alena Bills Veria Stradley

## 2023-01-15 ENCOUNTER — Encounter (HOSPITAL_BASED_OUTPATIENT_CLINIC_OR_DEPARTMENT_OTHER): Payer: Self-pay | Admitting: Plastic Surgery

## 2023-01-18 LAB — SURGICAL PATHOLOGY

## 2023-01-20 ENCOUNTER — Encounter: Payer: Medicare HMO | Admitting: Physician Assistant

## 2023-01-22 ENCOUNTER — Ambulatory Visit (INDEPENDENT_AMBULATORY_CARE_PROVIDER_SITE_OTHER): Payer: Medicare HMO | Admitting: Plastic Surgery

## 2023-01-22 DIAGNOSIS — N62 Hypertrophy of breast: Secondary | ICD-10-CM

## 2023-01-22 NOTE — Progress Notes (Signed)
Is a 67 year old female here for follow-up after undergoing bilateral breast reduction.  She had over 600 g removed from both breasts.  She looks like she has really good symmetry.  No sign of a hematoma or seroma.  The dressing was removed.  Steri-Strips stay in place.  We got her in a sports bra.  And we will plan to see her back in a few weeks.  No heavy lifting.  Pictures were obtained of the patient and placed in the chart with the patient's or guardian's permission.

## 2023-02-02 NOTE — Progress Notes (Unsigned)
Patient is a pleasant 67 year old female with PMH of macromastia s/p bilateral breast reduction with lateral liposuction performed 01/14/2023 by Dr. Ulice Bold who presents to clinic for postoperative follow-up.  Reviewed operative report and 716 g was removed from the right side, 600 g removed from the left side.  She was seen most recently here in clinic on 01/22/2023.  At that time, exam was reassuring without evidence of seroma or hematoma.  Recommended continued activity modifications and compressive garments.    Today,

## 2023-02-03 ENCOUNTER — Encounter: Payer: Self-pay | Admitting: Physician Assistant

## 2023-02-03 ENCOUNTER — Ambulatory Visit: Payer: Medicare HMO | Admitting: Physician Assistant

## 2023-02-03 VITALS — BP 147/85 | HR 96

## 2023-02-03 DIAGNOSIS — Z9889 Other specified postprocedural states: Secondary | ICD-10-CM

## 2023-02-15 ENCOUNTER — Other Ambulatory Visit: Payer: Self-pay | Admitting: Podiatry

## 2023-02-15 DIAGNOSIS — B353 Tinea pedis: Secondary | ICD-10-CM

## 2023-02-17 ENCOUNTER — Ambulatory Visit (INDEPENDENT_AMBULATORY_CARE_PROVIDER_SITE_OTHER): Payer: Medicare HMO | Admitting: Physician Assistant

## 2023-02-17 DIAGNOSIS — Z9889 Other specified postprocedural states: Secondary | ICD-10-CM

## 2023-02-17 NOTE — Progress Notes (Signed)
Patient is a pleasant 67 year old female with PMH of macromastia s/p bilateral breast reduction with lateral liposuction performed 01/14/2023 by Dr. Ulice Bold who presents to clinic for postoperative follow-up.   She was last seen here in clinic on 02/03/2023.  At that time, patient was pleased with outcome of surgery.  There was excess moisture along her inframammary incisions bilaterally causing some surrounding erythema.  No evidence concerning for infection, but emphasized the importance of keeping those areas dry to help prevent maceration of the skin and/for breakdown.  Exam was otherwise benign.  Today, patient is doing well.  She states that she will occasionally have fleeting discomfort on the most lateral aspect of right breast, but no significant ongoing discomfort.  Otherwise, doing quite well and with no issues or complaints.  On exam, breasts have excellent shape and symmetry.  NAC's are healthy and viable, considerable amount of overlying Dermabond noted.  Steri-Strips remain intact.  They were gently removed at bedside without complication, but there was some superficial skin tears due to the chronicity of placement.  No bleeding provoked.  Breasts are soft throughout, no obvious seroma or hematoma.  Recommending Vaseline twice daily to the incisions throughout as well as to the areas of skin where the Steri-Strips were placed.  This will help with residual Dermabond as well.  Follow-up in 10 days for likely final postoperative encounter.  Will obtain photos at that time.

## 2023-02-25 NOTE — Progress Notes (Signed)
Patient is a pleasant 67 year old female with PMH of macromastia s/p bilateral breast reduction with lateral liposuction performed 01/14/2023 by Dr. Ulice Bold who presents to clinic for postoperative follow-up.   She was last seen here in clinic on 02/17/2023.  At that time, she reported some fleeting discomfort in the most lateral aspect of right breast, but otherwise was doing well and without complaints.  Exam was benign.  Recommended Vaseline twice daily to the incisions throughout as well as the areas of her Steri-Strip tape dermatitis.  Follow-up in 10 days.  Today, patient is doing well.  She states that the redness underneath her breasts bilaterally has completely resolved.  She no longer has to put any gauze down there to help with residual moisture.  She also reports that the fleeting discomfort that she reported at the last encounter has resolved.  She denies any complaints or concerns at this time.  She is overall quite pleased with the outcome of her breast reduction surgery.  She does endorse improved upper back and neck discomfort since time of surgery.  On exam, breasts have excellent shape and symmetry.  Some residual Dermabond across the areolas bilaterally is debrided gently here in clinic as well as removal of scattered sutures.  Her vertical limb and inframammary incisions bilaterally have healed exceptionally well, minimal scarring.  No asymmetric swelling noted.  No seromas or hematomas.  She is doing excellent from a postoperative standpoint.  At this time, follow-up only as needed.  Recommend continued Vaseline to the areolas bilaterally to help with any residual Dermabond.  Also discussed silicone scar gels, but she has minimal scarring from her incisions.  Picture(s) obtained of the patient and placed in the chart were with the patient's or guardian's permission.

## 2023-02-26 ENCOUNTER — Encounter: Payer: Self-pay | Admitting: Physician Assistant

## 2023-02-26 ENCOUNTER — Ambulatory Visit (INDEPENDENT_AMBULATORY_CARE_PROVIDER_SITE_OTHER): Payer: Medicare HMO | Admitting: Physician Assistant

## 2023-02-26 VITALS — BP 124/79 | HR 98 | Ht 63.0 in | Wt 158.2 lb

## 2023-02-26 DIAGNOSIS — Z9889 Other specified postprocedural states: Secondary | ICD-10-CM | POA: Diagnosis not present

## 2023-03-29 ENCOUNTER — Other Ambulatory Visit: Payer: Self-pay | Admitting: Specialist

## 2023-03-29 DIAGNOSIS — Z1231 Encounter for screening mammogram for malignant neoplasm of breast: Secondary | ICD-10-CM

## 2023-04-01 ENCOUNTER — Ambulatory Visit: Payer: Medicare Other

## 2023-07-23 ENCOUNTER — Ambulatory Visit: Payer: Medicare HMO

## 2023-08-02 ENCOUNTER — Ambulatory Visit
Admission: RE | Admit: 2023-08-02 | Discharge: 2023-08-02 | Disposition: A | Discharge status: Home / Self Care | Source: Ambulatory Visit | Attending: Specialist | Admitting: Specialist

## 2023-08-02 DIAGNOSIS — Z1231 Encounter for screening mammogram for malignant neoplasm of breast: Secondary | ICD-10-CM

## 2023-08-17 ENCOUNTER — Other Ambulatory Visit: Payer: Self-pay | Admitting: Specialist

## 2023-08-17 DIAGNOSIS — Z1231 Encounter for screening mammogram for malignant neoplasm of breast: Secondary | ICD-10-CM

## 2023-09-22 ENCOUNTER — Ambulatory Visit

## 2023-12-07 ENCOUNTER — Emergency Department (HOSPITAL_COMMUNITY)

## 2023-12-07 ENCOUNTER — Emergency Department (HOSPITAL_COMMUNITY)
Admission: EM | Admit: 2023-12-07 | Discharge: 2023-12-07 | Disposition: A | Discharge status: Home / Self Care | Attending: Emergency Medicine | Admitting: Emergency Medicine

## 2023-12-07 ENCOUNTER — Encounter (HOSPITAL_COMMUNITY): Payer: Self-pay

## 2023-12-07 ENCOUNTER — Other Ambulatory Visit: Payer: Self-pay

## 2023-12-07 DIAGNOSIS — Z7984 Long term (current) use of oral hypoglycemic drugs: Secondary | ICD-10-CM | POA: Diagnosis not present

## 2023-12-07 DIAGNOSIS — R1033 Periumbilical pain: Secondary | ICD-10-CM | POA: Diagnosis not present

## 2023-12-07 DIAGNOSIS — R42 Dizziness and giddiness: Secondary | ICD-10-CM | POA: Insufficient documentation

## 2023-12-07 DIAGNOSIS — I10 Essential (primary) hypertension: Secondary | ICD-10-CM | POA: Diagnosis not present

## 2023-12-07 DIAGNOSIS — R109 Unspecified abdominal pain: Secondary | ICD-10-CM

## 2023-12-07 DIAGNOSIS — E119 Type 2 diabetes mellitus without complications: Secondary | ICD-10-CM | POA: Diagnosis not present

## 2023-12-07 DIAGNOSIS — Z79899 Other long term (current) drug therapy: Secondary | ICD-10-CM | POA: Diagnosis not present

## 2023-12-07 DIAGNOSIS — R1030 Lower abdominal pain, unspecified: Secondary | ICD-10-CM | POA: Diagnosis present

## 2023-12-07 LAB — URINALYSIS, ROUTINE W REFLEX MICROSCOPIC
Bacteria, UA: NONE SEEN
Bilirubin Urine: NEGATIVE
Glucose, UA: >=500 mg/dL — AB
Ketones, ur: NEGATIVE mg/dL
Leukocytes,Ua: NEGATIVE
Nitrite: NEGATIVE
Protein, ur: NEGATIVE mg/dL
Specific Gravity, Urine: 1.029 (ref 1.005–1.030)
pH: 5 (ref 5.0–8.0)

## 2023-12-07 LAB — CBC
HCT: 40.4 % (ref 36.0–46.0)
Hemoglobin: 12.3 g/dL (ref 12.0–15.0)
MCH: 26.5 pg (ref 26.0–34.0)
MCHC: 30.4 g/dL (ref 30.0–36.0)
MCV: 87.1 fL (ref 80.0–100.0)
Platelets: 179 K/uL (ref 150–400)
RBC: 4.64 MIL/uL (ref 3.87–5.11)
RDW: 13.4 % (ref 11.5–15.5)
WBC: 7 K/uL (ref 4.0–10.5)
nRBC: 0 % (ref 0.0–0.2)

## 2023-12-07 LAB — COMPREHENSIVE METABOLIC PANEL WITH GFR
ALT: 13 U/L (ref 0–44)
AST: 20 U/L (ref 15–41)
Albumin: 3.7 g/dL (ref 3.5–5.0)
Alkaline Phosphatase: 61 U/L (ref 38–126)
Anion gap: 9 (ref 5–15)
BUN: 15 mg/dL (ref 8–23)
CO2: 25 mmol/L (ref 22–32)
Calcium: 9.5 mg/dL (ref 8.9–10.3)
Chloride: 105 mmol/L (ref 98–111)
Creatinine, Ser: 1.17 mg/dL — ABNORMAL HIGH (ref 0.44–1.00)
GFR, Estimated: 51 mL/min — ABNORMAL LOW (ref >60)
Glucose, Bld: 154 mg/dL — ABNORMAL HIGH (ref 70–99)
Potassium: 4.2 mmol/L (ref 3.5–5.1)
Sodium: 139 mmol/L (ref 135–145)
Total Bilirubin: 0.7 mg/dL (ref 0.0–1.2)
Total Protein: 7 g/dL (ref 6.5–8.1)

## 2023-12-07 LAB — TROPONIN I (HIGH SENSITIVITY): Troponin I (High Sensitivity): 3 ng/L (ref <18)

## 2023-12-07 LAB — LIPASE, BLOOD: Lipase: 25 U/L (ref 11–51)

## 2023-12-07 MED ORDER — MORPHINE SULFATE (PF) 4 MG/ML IV SOLN
4.0000 mg | Freq: Once | INTRAVENOUS | Status: AC
  Administered 2023-12-07: 4 mg via INTRAVENOUS
  Filled 2023-12-07: qty 1

## 2023-12-07 MED ORDER — SODIUM CHLORIDE 0.9 % IV BOLUS
1000.0000 mL | Freq: Once | INTRAVENOUS | Status: AC
  Administered 2023-12-07: 1000 mL via INTRAVENOUS

## 2023-12-07 MED ORDER — ONDANSETRON HCL 4 MG/2ML IJ SOLN
4.0000 mg | Freq: Once | INTRAMUSCULAR | Status: AC
  Administered 2023-12-07: 4 mg via INTRAVENOUS
  Filled 2023-12-07: qty 2

## 2023-12-07 MED ORDER — IOHEXOL 350 MG/ML SOLN
75.0000 mL | Freq: Once | INTRAVENOUS | Status: AC | PRN
  Administered 2023-12-07: 75 mL via INTRAVENOUS

## 2023-12-07 NOTE — ED Provider Notes (Signed)
  Physical Exam  BP 120/69   Pulse 88   Temp (!) 97.5 F (36.4 C)   Resp 18   Ht 5' 2 (1.575 m)   Wt 71.2 kg   SpO2 100%   BMI 28.72 kg/m   Physical Exam Vitals and nursing note reviewed.  Constitutional:      General: She is not in acute distress.    Appearance: She is well-developed.  HENT:     Head: Normocephalic and atraumatic.  Eyes:     Conjunctiva/sclera: Conjunctivae normal.  Cardiovascular:     Rate and Rhythm: Normal rate and regular rhythm.     Heart sounds: No murmur heard. Pulmonary:     Effort: Pulmonary effort is normal. No respiratory distress.     Breath sounds: Normal breath sounds.  Abdominal:     Palpations: Abdomen is soft.     Tenderness: There is no abdominal tenderness.  Musculoskeletal:        General: No swelling.     Cervical back: Neck supple.  Skin:    General: Skin is warm and dry.     Capillary Refill: Capillary refill takes less than 2 seconds.  Neurological:     Mental Status: She is alert.  Psychiatric:        Mood and Affect: Mood normal.     Procedures  Procedures  ED Course / MDM    Medical Decision Making Amount and/or Complexity of Data Reviewed Labs: ordered. Radiology: ordered.  Risk Prescription drug management.   Patient received in handoff.  Abdominal pain with some positional dizziness pending CT scan.  CT on pelvis is unremarkable.  Patient fluid resuscitated and repeat orthostatic vital signs are reassuring and patient did not have any persistent dizziness or orthostasis.  At this time she does not meet inpatient with you for admission be discharged outpatient follow-up       Albertina Dixon, MD 12/07/23 1643

## 2023-12-07 NOTE — ED Provider Notes (Addendum)
 [place] EMERGENCY DEPARTMENT AT [facility] Provider Note  CSN: [id number] Arrival date & time: 12/07/23 1136  Chief Complaint(s) Abdominal Pain  HPI Deborah Camacho is a 68 y.o. female with past medical history as below, significant for HLD, DM2, HTN, obesity who presents to the ED with complaint of abdominal pain, lightheadedness  Patient here with family, reports that this morning she was having lower abdominal pain, sharp, cramping at times.  Patient feels the pain has somewhat improved since the onset but is still present.  She also reports when the pain becomes severe she feels lightheaded and woozy.  Denies any chest pain or palpitations.  She reports that she did vomit earlier today but no longer is having any nausea.  Denies similar symptoms in the past.  Normal state felt prior to onset of symptoms.  No change of bowel or bladder function.  No recent travel or sick contacts   Abdominal surgery surgery includes total hysterectomy with bilateral salpingo-oophorectomy  Past Medical History Past Medical History:  Diagnosis Date   Arthritis    Arthritis    both legs   Back pain    Diabetes mellitus    TYPE 2   Dyslipidemia    Hand pain    Hyperlipidemia    Hypertension    Leg pain    Vitamin D  deficiency    Patient Active Problem List   Diagnosis Date Noted   Symptomatic mammary hypertrophy 10/28/2022   Complex endometrial hyperplasia with atypia 11/04/2017   Endometrial hyperplasia 10/25/2017   Shoulder pain, right 09/01/2017   Hypertension associated with diabetes (HCC) 12/01/2011   Obesity (BMI 30-39.9) 12/01/2011   Diabetes mellitus (HCC) 10/27/2010   Osteomalacia, vitamin D  deficient 10/27/2010   Hyperlipidemia LDL goal <70 10/27/2010   Chronic back pain greater than 3 months duration 10/27/2010   Home Medication(s) Prior to Admission medications   Medication Sig Start Date End Date Taking? Authorizing Provider  atorvastatin (LIPITOR) 40 MG  tablet atorvastatin 40 mg tablet    [provider]  Blood Glucose Monitoring Suppl (ACCU-CHEK GUIDE ME) w/Device KIT 3 (three) times daily. for testing 04/07/22   [provider]  fluticasone  (FLONASE ) 50 MCG/ACT nasal spray Place 2 sprays into both nostrils daily. 04/29/19   Van Knee, MD  gabapentin  (NEURONTIN ) 100 MG capsule Take 100 mg by mouth 2 (two) times daily. 08/06/21   [provider]  glimepiride (AMARYL) 4 MG tablet Take 4 mg by mouth at bedtime. 08/30/17   [provider]  hydrOXYzine (ATARAX) 25 MG tablet Take 25 mg by mouth 2 (two) times daily as needed. 11/14/21   [provider]  INVOKANA 100 MG TABS tablet Take 100 mg by mouth daily. 08/30/17   [provider]  JARDIANCE 25 MG TABS tablet Take 25 mg by mouth daily. 03/11/20   [provider]  ketoconazole  (NIZORAL ) 2 % cream Apply to both feet and between toes once daily for 6 weeks. 02/16/23   Standiford, Marsa FALCON, DPM  lisinopril -hydrochlorothiazide  (PRINZIDE ,ZESTORETIC ) 20-25 MG tablet Take 1 tablet by mouth daily.    [provider]  Multiple Vitamin (MULTIVITAMIN WITH MINERALS) TABS tablet Take 1 tablet by mouth daily.    [provider]  NARCAN 4 MG/0.1ML LIQD nasal spray kit  10/27/17   [provider]  omeprazole (PRILOSEC) 20 MG capsule Take 20 mg by mouth every morning. 01/01/20   [provider]  ondansetron  (ZOFRAN -ODT) 4 MG disintegrating tablet Take 1 tablet (4  mg total) by mouth every 8 (eight) hours as needed for nausea or vomiting. 12/30/22   Landy Honora CROME, PA-C  oxyCODONE -acetaminophen  (PERCOCET) 10-325 MG tablet Take 1 tablet by mouth every 4 (four) hours as needed for pain. 11/05/17   Rogelio Planas, MD  rosuvastatin  (CRESTOR ) 40 MG tablet     [provider]  traZODone (DESYREL) 50 MG tablet Take 50 mg by mouth at bedtime as needed. 05/30/21   [provider]  TRULICITY 3 MG/0.5ML SOPN  Inject 3 mg into the skin once a week. Every Tuesday 08/04/22   [provider]  valsartan-hydrochlorothiazide  (DIOVAN-HCT) 160-12.5 MG tablet Take 1 tablet by mouth daily. 11/13/21   [provider]                                                                                                                                    Past Surgical History Past Surgical History:  Procedure Laterality Date   ABDOMINAL HYSTERECTOMY     BREAST REDUCTION SURGERY Bilateral 01/14/2023   Procedure: MAMMARY REDUCTION  (BREAST);  Surgeon: Lowery Estefana RAMAN, DO;  Location: [facility];  Service: Plastics;  Laterality: Bilateral;   CARPAL TUNNEL RELEASE Bilateral    REDUCTION MAMMAPLASTY Bilateral 12/2022   ROBOTIC ASSISTED TOTAL HYSTERECTOMY WITH BILATERAL SALPINGO OOPHERECTOMY Bilateral 11/04/2017   Procedure: XI ROBOTIC ASSISTED TOTAL HYSTERECTOMY WITH BILATERAL SALPINGO OOPHORECTOMY;  Surgeon: Eloy Herring, MD;  Location: [facility];  Service: Gynecology;  Laterality: Bilateral;   SENTINEL NODE BIOPSY N/A 11/04/2017   Procedure: SENTINEL LYMPH NODE BIOPSY;  Surgeon: Eloy Herring, MD;  Location: [facility];  Service: Gynecology;  Laterality: N/A;   TUBAL LIGATION  1978   Family History Family History  Problem Relation Age of Onset   Pancreatic cancer Sister    Lung cancer Maternal Grandmother    Breast cancer Maternal Aunt    Breast cancer Maternal Aunt    Pancreatic cancer Maternal Uncle    Lung cancer Maternal Uncle    Breast cancer Cousin    Breast cancer Cousin    Hypertension Other    Cancer Other    Colon cancer Neg Hx    Stomach cancer Neg Hx    Esophageal cancer Neg Hx     Social History Social History   Tobacco Use   Smoking status: Never   Smokeless tobacco: Never  Vaping Use   Vaping status: Never Used  Substance Use Topics   Alcohol use: No   Drug use: Never   Allergies Patient has no known allergies.  Review of Systems A thorough review of  systems was obtained and all systems are negative except as noted in the HPI and PMH.   Physical Exam Vital Signs  I have reviewed the triage vital signs BP 125/68 (BP Location: Right Arm)   Pulse 77   Temp (!) 97.5 F (36.4 C)   Resp 19   Ht 5'  2 (1.575 m)   Wt 71.2 kg   SpO2 99%   BMI 28.72 kg/m  Physical Exam Vitals and nursing note reviewed.  Constitutional:      General: She is not in acute distress.    Appearance: Normal appearance. She is well-developed. She is not ill-appearing.  HENT:     Head: Normocephalic and atraumatic.     Right Ear: External ear normal.     Left Ear: External ear normal.     Nose: Nose normal.     Mouth/Throat:     Mouth: Mucous membranes are moist.  Eyes:     General: No scleral icterus.       Right eye: No discharge.        Left eye: No discharge.  Cardiovascular:     Rate and Rhythm: Normal rate.     Pulses: Normal pulses.  Pulmonary:     Effort: Pulmonary effort is normal. No respiratory distress.     Breath sounds: No stridor.  Abdominal:     General: Abdomen is flat. There is no distension.     Palpations: Abdomen is soft.     Tenderness: There is abdominal tenderness in the periumbilical area and suprapubic area. There is no guarding. Negative signs include Murphy's sign.  Musculoskeletal:        General: No deformity.     Cervical back: No rigidity.  Skin:    General: Skin is warm and dry.     Coloration: Skin is not cyanotic, jaundiced or pale.  Neurological:     Mental Status: She is alert and oriented to person, place, and time.     GCS: GCS eye subscore is 4. GCS verbal subscore is 5. GCS motor subscore is 6.  Psychiatric:        Speech: Speech normal.        Behavior: Behavior normal. Behavior is cooperative.     ED Results and Treatments Labs (all labs ordered are listed, but only abnormal results are displayed) Labs Reviewed  COMPREHENSIVE METABOLIC PANEL WITH GFR - Abnormal; Notable for the following  components:      Result Value   Glucose, Bld 154 (*)    Creatinine, Ser 1.17 (*)    GFR, Estimated 51 (*)    All other components within normal limits  URINALYSIS, ROUTINE W REFLEX MICROSCOPIC - Abnormal; Notable for the following components:   Glucose, UA >=500 (*)    Hgb urine dipstick SMALL (*)    All other components within normal limits  LIPASE, BLOOD  CBC  TROPONIN I (HIGH SENSITIVITY)                                                                                                                          Radiology No results found.  Pertinent labs & imaging results that were available during my care of the patient were reviewed by me and considered in my medical decision making (see MDM for details).  Medications  Ordered in ED Medications  sodium chloride  0.9 % bolus 1,000 mL (has no administration in time range)  morphine  (PF) 4 MG/ML injection 4 mg (has no administration in time range)  ondansetron  (ZOFRAN ) injection 4 mg (has no administration in time range)                                                                                                                                     Procedures Procedures  (including critical care time)  Medical Decision Making / ED Course    Medical Decision Making:    FAUNA NEUNER is a 68 y.o. female with past medical history as below, significant for HLD, DM2, HTN, obesity who presents to the ED with complaint of abdominal pain, lightheadedness. The complaint involves an extensive differential diagnosis and also carries with it a high risk of complications and morbidity.  Serious etiology was considered. Ddx includes but is not limited to: Differential diagnosis includes but is not exclusive to acute appendicitis, urinary tract infection, endometriosis, bowel obstruction, hernia, colitis, renal colic, gastroenteritis, volvulus, dehydration, arrhythmia, ACS, metabolic derangement etc.   Complete initial physical exam  performed, notably the patient was in no acute stress, symptoms improved, HDS.    Reviewed and confirmed nursing documentation for past medical history, family history, social history.  Vital signs reviewed.    Abdominal pain > - Symptoms have improved since the onset, abdomen soft, not peritoneal - Labs are reassuring - CTAP  Lightheadedness/near syncope > - EKG stable, telemetry monitoring, check troponins, chest x-ray - CXR neg, labs stable, EKG stable - likely postural dizziness, no evidence of arrhythmia or life threatening etiology, symptoms have resolved       Handoff to incoming EDP at shift change pending remainder of labs and imaging, re-assessment.               Additional history obtained: -Additional history obtained from family -External records from outside source obtained and reviewed including: Chart review including previous notes, labs, imaging, consultation notes including  Primary care documentation, home medications, prior labs   Lab Tests: -I ordered, reviewed, and interpreted labs.   The pertinent results include:   Labs Reviewed  COMPREHENSIVE METABOLIC PANEL WITH GFR - Abnormal; Notable for the following components:      Result Value   Glucose, Bld 154 (*)    Creatinine, Ser 1.17 (*)    GFR, Estimated 51 (*)    All other components within normal limits  URINALYSIS, ROUTINE W REFLEX MICROSCOPIC - Abnormal; Notable for the following components:   Glucose, UA >=500 (*)    Hgb urine dipstick SMALL (*)    All other components within normal limits  LIPASE, BLOOD  CBC  TROPONIN I (HIGH SENSITIVITY)    Notable for labs stable thus far  EKG   EKG Interpretation Date/Time:    Ventricular Rate:    PR Interval:    QRS  Duration:    QT Interval:    QTC Calculation:   R Axis:      Text Interpretation:           Imaging Studies ordered: I ordered imaging studies including chest x-ray, CTAP I independently visualized the following  imaging with scope of interpretation limited to determining acute life threatening conditions related to emergency care; findings noted above I agree with the radiologist interpretation If any imaging was obtained with contrast I closely monitored patient for any possible adverse reaction a/w contrast administration in the emergency department   Medicines ordered and prescription drug management: Meds ordered this encounter  Medications   sodium chloride  0.9 % bolus 1,000 mL   morphine  (PF) 4 MG/ML injection 4 mg   ondansetron  (ZOFRAN ) injection 4 mg    -I have reviewed the patients home medicines and have made adjustments as needed   Consultations Obtained: na   Cardiac Monitoring: The patient was maintained on a cardiac monitor.  I personally viewed and interpreted the cardiac monitored which showed an underlying rhythm of: nsr Continuous pulse oximetry interpreted by myself, 99% on ra.    Social Determinants of Health:  Diagnosis or treatment significantly limited by social determinants of health: na   Reevaluation: After the interventions noted above, I reevaluated the patient and found that they have improved  Co morbidities that complicate the patient evaluation  Past Medical History:  Diagnosis Date   Arthritis    Arthritis    both legs   Back pain    Diabetes mellitus    TYPE 2   Dyslipidemia    Hand pain    Hyperlipidemia    Hypertension    Leg pain    Vitamin D  deficiency       Dispostion: Disposition decision including need for hospitalization was considered, and patient disposition pending at time of sign out.    Final Clinical Impression(s) / ED Diagnoses Final diagnoses:  Abdominal pain, unspecified abdominal location  Dizziness        Elnor Jayson LABOR, DO 12/13/23 1004    Elnor Jayson A, DO 12/13/23 1005

## 2023-12-07 NOTE — ED Notes (Signed)
..  The patient is A&OX4, wheeled out of ED via wheelchair. Pt verbalized understanding of d/c instructions and follow up care.

## 2023-12-07 NOTE — ED Triage Notes (Signed)
 Patient reports she got up this morning to go to the bathroom and her stomach was cramping real bad so she went to the bathroom, then she laid back down and broke out into a sweat, then threw up. Patient reports pain is gone at this time. Patient states she just wants to make sure she's alright.

## 2023-12-07 NOTE — ED Notes (Signed)
 CCMD is monitoring this pt.

## 2024-03-06 ENCOUNTER — Ambulatory Visit (INDEPENDENT_AMBULATORY_CARE_PROVIDER_SITE_OTHER): Admitting: Podiatry

## 2024-03-06 ENCOUNTER — Encounter: Payer: Self-pay | Admitting: Podiatry

## 2024-03-06 DIAGNOSIS — M79674 Pain in right toe(s): Secondary | ICD-10-CM

## 2024-03-06 DIAGNOSIS — B351 Tinea unguium: Secondary | ICD-10-CM | POA: Diagnosis not present

## 2024-03-06 DIAGNOSIS — E1140 Type 2 diabetes mellitus with diabetic neuropathy, unspecified: Secondary | ICD-10-CM | POA: Diagnosis not present

## 2024-03-06 DIAGNOSIS — M79675 Pain in left toe(s): Secondary | ICD-10-CM

## 2024-03-06 DIAGNOSIS — E1149 Type 2 diabetes mellitus with other diabetic neurological complication: Secondary | ICD-10-CM | POA: Diagnosis not present

## 2024-03-06 NOTE — Progress Notes (Signed)
This patient returns to my office for at risk foot care.  This patient requires this care by a professional since this patient will be at risk due to having diabetic neuropathy.  This patient is unable to cut nails herself since the patient cannot reach her nails.These nails are painful walking and wearing shoes.  This patient presents for at risk foot care today.    General Appearance  Alert, conversant and in no acute stress.  Vascular  Dorsalis pedis and posterior tibial  pulses are palpable  bilaterally.  Capillary return is within normal limits  bilaterally. Temperature is within normal limits  bilaterally.  Neurologic  Senn-Weinstein monofilament wire test within normal limits  bilaterally. Muscle power within normal limits bilaterally.  Nails Thick disfigured discolored nails with subungual debris  from hallux to fifth toes bilaterally. No evidence of bacterial infection or drainage bilaterally.  Orthopedic  No limitations of motion  feet .  No crepitus or effusions noted.  No bony pathology or digital deformities noted.   Skin  normotropic skin with no porokeratosis noted bilaterally.  No signs of infections or ulcers noted.     Onychomycosis  Pain in right toes  Pain in left toes  Consent was obtained for treatment procedures.   Mechanical debridement of nails 1-5  bilaterally performed with a nail nipper.  Filed with dremel without incident    Return office visit   4  months                   Told patient to return for periodic foot care and evaluation due to potential at risk complications.   Astou Lada DPM  

## 2024-07-05 ENCOUNTER — Ambulatory Visit: Admitting: Podiatry
